# Patient Record
Sex: Female | Born: 1940 | Race: White | Hispanic: No | Marital: Married
Health system: Southern US, Community
[De-identification: ages and names within clinical notes are randomized; demographics above are authoritative.]

---

## 1998-12-05 ENCOUNTER — Emergency Department (HOSPITAL_COMMUNITY): Admission: EM | Admit: 1998-12-05 | Discharge: 1998-12-05 | Payer: Self-pay | Admitting: Emergency Medicine

## 1999-12-28 ENCOUNTER — Other Ambulatory Visit: Admission: RE | Admit: 1999-12-28 | Discharge: 1999-12-28 | Payer: Self-pay | Admitting: Obstetrics and Gynecology

## 2001-01-22 ENCOUNTER — Other Ambulatory Visit: Admission: RE | Admit: 2001-01-22 | Discharge: 2001-01-22 | Payer: Self-pay | Admitting: Obstetrics and Gynecology

## 2002-01-24 ENCOUNTER — Other Ambulatory Visit: Admission: RE | Admit: 2002-01-24 | Discharge: 2002-01-24 | Payer: Self-pay | Admitting: Obstetrics and Gynecology

## 2004-02-25 ENCOUNTER — Other Ambulatory Visit: Admission: RE | Admit: 2004-02-25 | Discharge: 2004-02-25 | Payer: Self-pay | Admitting: Obstetrics and Gynecology

## 2004-06-01 ENCOUNTER — Emergency Department (HOSPITAL_COMMUNITY): Admission: EM | Admit: 2004-06-01 | Discharge: 2004-06-01 | Payer: Self-pay | Admitting: Emergency Medicine

## 2005-09-25 ENCOUNTER — Emergency Department (HOSPITAL_COMMUNITY): Admission: EM | Admit: 2005-09-25 | Discharge: 2005-09-25 | Payer: Self-pay | Admitting: Emergency Medicine

## 2007-06-06 ENCOUNTER — Emergency Department (HOSPITAL_COMMUNITY): Admission: EM | Admit: 2007-06-06 | Discharge: 2007-06-06 | Payer: Self-pay | Admitting: Emergency Medicine

## 2008-11-02 ENCOUNTER — Emergency Department (HOSPITAL_BASED_OUTPATIENT_CLINIC_OR_DEPARTMENT_OTHER): Admission: EM | Admit: 2008-11-02 | Discharge: 2008-11-02 | Payer: Self-pay | Admitting: Emergency Medicine

## 2009-10-16 ENCOUNTER — Emergency Department (HOSPITAL_BASED_OUTPATIENT_CLINIC_OR_DEPARTMENT_OTHER): Admission: EM | Admit: 2009-10-16 | Discharge: 2009-10-16 | Payer: Self-pay | Admitting: Emergency Medicine

## 2009-10-19 ENCOUNTER — Emergency Department (HOSPITAL_COMMUNITY): Admission: EM | Admit: 2009-10-19 | Discharge: 2009-10-19 | Payer: Self-pay | Admitting: Family Medicine

## 2009-11-13 ENCOUNTER — Ambulatory Visit: Payer: Self-pay | Admitting: Diagnostic Radiology

## 2009-11-13 ENCOUNTER — Inpatient Hospital Stay (HOSPITAL_COMMUNITY): Admission: RE | Admit: 2009-11-13 | Discharge: 2009-11-17 | Payer: Self-pay | Admitting: Internal Medicine

## 2009-11-13 ENCOUNTER — Encounter: Payer: Self-pay | Admitting: Emergency Medicine

## 2009-11-15 ENCOUNTER — Ambulatory Visit: Payer: Self-pay | Admitting: Gastroenterology

## 2009-11-16 ENCOUNTER — Encounter: Payer: Self-pay | Admitting: Gastroenterology

## 2009-11-16 ENCOUNTER — Ambulatory Visit: Payer: Self-pay | Admitting: Oncology

## 2009-11-16 ENCOUNTER — Encounter (INDEPENDENT_AMBULATORY_CARE_PROVIDER_SITE_OTHER): Payer: Self-pay | Admitting: Internal Medicine

## 2009-11-17 ENCOUNTER — Encounter (INDEPENDENT_AMBULATORY_CARE_PROVIDER_SITE_OTHER): Payer: Self-pay | Admitting: *Deleted

## 2009-11-25 ENCOUNTER — Encounter (INDEPENDENT_AMBULATORY_CARE_PROVIDER_SITE_OTHER): Payer: Self-pay | Admitting: *Deleted

## 2010-01-11 ENCOUNTER — Ambulatory Visit: Payer: Self-pay | Admitting: Gastroenterology

## 2010-01-11 DIAGNOSIS — E119 Type 2 diabetes mellitus without complications: Secondary | ICD-10-CM

## 2010-01-12 ENCOUNTER — Telehealth: Payer: Self-pay | Admitting: Gastroenterology

## 2010-01-25 ENCOUNTER — Encounter: Payer: Self-pay | Admitting: Gastroenterology

## 2010-02-04 ENCOUNTER — Ambulatory Visit: Payer: Self-pay | Admitting: Gastroenterology

## 2010-02-04 DIAGNOSIS — K5289 Other specified noninfective gastroenteritis and colitis: Secondary | ICD-10-CM | POA: Insufficient documentation

## 2010-02-08 ENCOUNTER — Telehealth: Payer: Self-pay | Admitting: Gastroenterology

## 2010-02-15 ENCOUNTER — Telehealth: Payer: Self-pay | Admitting: Gastroenterology

## 2010-03-15 ENCOUNTER — Ambulatory Visit: Payer: Self-pay | Admitting: Gastroenterology

## 2010-04-21 ENCOUNTER — Encounter (INDEPENDENT_AMBULATORY_CARE_PROVIDER_SITE_OTHER): Payer: Self-pay | Admitting: *Deleted

## 2011-01-13 NOTE — Progress Notes (Signed)
Summary: update  Phone Note Call from Patient Call back at Home Phone [phone]   Caller: Patient Call For: Dr. Arlyce Dice Reason for Call: Talk to Nurse Summary of Call: pt calling to give update on Entocort... pt says "this seems to be doing the trick"... pt also states that she has an upcoming appt, but doesnt feel this is necessary if the meds are working... pt would like aother opinion on that Initial call taken by: Vallarie Mare,  February 15, 2010 9:12 AM  Follow-up for Phone Call         Pt's last ov was 02/04/2010. Next appt. is scheduled for 03/15/2010. Need your advice as pt. doesn't  think visit necessary since Entocort is working. Follow-up by: Teryl Lucy RN,  February 15, 2010 10:15 AM  Additional Follow-up for Phone Call Additional follow up Details #1::        needs OV in 4 weeks Additional Follow-up by: Louis Meckel MD,  February 15, 2010 10:38 AM    Additional Follow-up for Phone Call Additional follow up Details #2::     Pt. will keep appt. for 03/15/2010. Follow-up by: Teryl Lucy RN,  February 15, 2010 10:46 AM

## 2011-01-13 NOTE — Progress Notes (Signed)
Summary: CONDITION UPDATE  Phone Note Call from Patient Call back at Home Phone [phone]   Caller: Patient Call For: Dr. Arlyce Dice Reason for Call: Talk to Nurse Summary of Call: Pt said the Pepto-Bismol is not working with her Diarrhea. Wants to know what else she can do? Initial call taken by: Karna Christmas,  February 08, 2010 9:15 AM  Follow-up for Phone Call        CONDITION UPDATE--Pt. was seen 02-04-10, advised to try Pepto-bismol for diarrhea, "It didn't work at all"  DR.KAPLAN PLEASE FURTHER ADVISE  Follow-up by: Laureen Ochs LPN,  February 08, 2010 10:00 AM  Additional Follow-up for Phone Call Additional follow up Details #1::        try entocort 9mg  once daily.  c/b in 4-5 days Additional Follow-up by: Louis Meckel MD,  February 08, 2010 4:52 PM    Additional Follow-up for Phone Call Additional follow up Details #2::    Above MD orders reviewed with patient. Meds. to pharmacy. Pt. will c/b with update in 1 week, sooner as needed. Follow-up by: Laureen Ochs LPN,  February 09, 2010 9:13 AM  New/Updated Medications: ENTOCORT EC 3 MG  CP24 (BUDESONIDE) Take 3 capsules 1 time daily Prescriptions: ENTOCORT EC 3 MG  CP24 (BUDESONIDE) Take 3 capsules 1 time daily  #90 x 3   Entered by:   Laureen Ochs LPN   Authorized by:   Louis Meckel MD   Signed by:   Laureen Ochs LPN on 32/44/0102   Method used:   Electronically to        [facility] [address] #[id number]* (retail)       [address]       [place], [place]  [address]       Ph: [phone]       Fax: [phone]   RxID:   [id number]

## 2011-01-13 NOTE — Assessment & Plan Note (Signed)
Summary: [facility] F/U--[facility]   History of Present Illness Visit Type: Initial Visit Primary GI MD: [name] MD [facility] Primary Provider: Adrian Prince, MD Chief Complaint: Hsop. F/U colitis; patient still having some diarrhea History of Present Illness:   [name] has returned for followup for colitis.  In December, 2010 she was hospitalized with hypercalcemia and diarrhea.  Colonoscopy demonstrated areas as mucosal hemorrhage.  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  She complains of diarrhea consisting of at least 2 bowel movements a day.  They are accompanied  by urgency.  She has no abdominal  pain or bleeding.  other medical problems including type 1 diabetes and hypertension are stable.  She has an abnormal serum protein electrophoresis and is undergoing workup for possible multiple myeloma   GI Review of Systems      Denies abdominal pain, acid reflux, belching, bloating, chest pain, dysphagia with liquids, dysphagia with solids, heartburn, loss of appetite, nausea, vomiting, vomiting blood, weight loss, and  weight gain.      Reports diarrhea.     Denies anal fissure, black tarry stools, change in bowel habit, constipation, diverticulosis, fecal incontinence, heme positive stool, hemorrhoids, irritable bowel syndrome, jaundice, light color stool, liver problems, rectal bleeding, and  rectal pain. Preventive Screening-Counseling & Management  Alcohol-Tobacco     Smoking Status: quit      Drug Use:  no.      Current Medications (verified): 1)  Lantus 100 Unit/ml Soln (Insulin Glargine) .... 7-10 Units Each Evening 2)  Novolog 100 Unit/ml Soln (Insulin Aspart) .... Sliding Scale 3)  Aspirin 81 Mg Tbec (Aspirin) .Marland Kitchen.. 1 By Mouth Once Daily 4)  Synthroid 175 Mcg Tabs (Levothyroxine Sodium) .Marland Kitchen.. 1 By Mouth Once Daily 5)  Lipitor 10 Mg Tabs (Atorvastatin Calcium) .Marland Kitchen.. 1 By Mouth Once Daily 6)  Multivitamins  Tabs (Multiple Vitamin) .Marland Kitchen.. 1 By Mouth Once  Daily 7)  Diovan 320 Mg Tabs (Valsartan) .Marland Kitchen.. 1 By Mouth Once Daily 8)  Ramipril 10 Mg Caps (Ramipril) .Marland Kitchen.. 1 By Mouth Two Times A Day 9)  Boniva 150 Mg Tabs (Ibandronate Sodium) .Marland Kitchen.. 1 By Mouth Every Month 10)  Ambien 10 Mg Tabs (Zolpidem Tartrate) .... As Needed 11)  Align  Caps (Probiotic Product) .Marland Kitchen.. 1 By Mouth Once Daily 12)  Sm Vitamin D3 1000 Unit Tabs (Cholecalciferol) .... Once Daily 13)  Fish Oil 1000 Mg Caps (Omega-3 Fatty Acids) .... Once Daily  Allergies (verified): No Known Drug Allergies  Past History:  Past Medical History: Reviewed history from 01/07/2010 and no changes required. mild colitis hypercalcemia abnormal serum protein electrohoresis with myeloma hyperkalemia trivial anemia type 1 diabetes Hypertension osteoporosis  Past Surgical History: Appendectomy 1952  Family History: Family History of Diabetes:  No FH of Colon Cancer:  Social History: Occupation: Retired Patient is a former smoker.  Alcohol Use - yes Daily Caffeine Use Illicit Drug Use - no Smoking Status:  quit Drug Use:  no  Review of Systems  The patient denies allergy/sinus, anemia, anxiety-new, arthritis/joint pain, back pain, blood in urine, breast changes/lumps, change in vision, confusion, cough, coughing up blood, depression-new, fainting, fatigue, fever, headaches-new, hearing problems, heart murmur, heart rhythm changes, itching, menstrual pain, muscle pains/cramps, night sweats, nosebleeds, pregnancy symptoms, shortness of breath, skin rash, sleeping problems, sore throat, swelling of feet/legs, swollen lymph glands, thirst - excessive , urination - excessive , urination changes/pain, urine leakage, vision changes, and voice change.    Vital Signs:  Patient profile:   70 year  old female Height:      63.5 inches Weight:      134.13 pounds BMI:     23.47 Pulse rate:   64 / minute Pulse rhythm:   regular BP sitting:   132 / 64  (left arm) Cuff size:   regular  Vitals  Entered By: June McMurray CMA Duncan Dull) (January 11, 2010 10:31 AM)   Impression & Recommendations:  Problem # 1:  OTH&UNSPEC NONINFECTIOUS GASTROENTERITIS&COLITIS (ICD-558.9) Patient appears to have collagenous colitis and is moderately symptomatic.  Recommendations #1 trial of lialda 2.4 g daily  Problem # 2:  ? of MULTIPLE  MYELOMA (ICD-203.00) Workup per Dr. Evlyn Kanner  Problem # 3:  DM (ICD-250.00) Assessment: Comment Only Prescriptions: LIALDA 1.2 GM TBEC (MESALAMINE) take 2 tabs daily  #60 x 2   Entered and Authorized by:   Louis Meckel MD   Signed by:   Louis Meckel MD on 01/11/2010   Method used:   Electronically to        [facility] [address] #[id number]* (retail)       [address]       [place], [place]  [address]       Ph: [phone]       Fax: [phone]   RxID:   [id number]

## 2011-01-13 NOTE — Letter (Signed)
Summary: Office Visit Letter   Gastroenterology  [address] [place], [place] [address]   Phone: [phone]  Fax: [phone]      Apr 21, 2010 MRN: [id number]   Ann Carroll [address] [place], [place]  [address]   Dear [name],   According to our records, it is time for you to schedule a follow-up office visit with [place] in the month of June 2011.   At your convenience, please call [phone] (option #2)to schedule an office visit. If you have any questions, concerns, or feel that this letter is in error, we would appreciate your call.   Sincerely,   Barbette Hair. Arlyce Dice, M.D.  [facility]astro[facility]ology [facility] [phone]

## 2011-01-13 NOTE — Progress Notes (Signed)
Summary: med change request  Phone Note Call from Patient Call back at Home Phone [phone]   Caller: Patient Call For: Dr. Arlyce Dice Reason for Call: Talk to Nurse Summary of Call: pt rx'ed medication for diarrhea yesterday per pt, but pt's ins will not cover it... pt doesnt recall med name... is there something else she can be rx'ed or pt wants to know if Dr. Arlyce Dice will call her ins co Initial call taken by: Vallarie Mare,  January 12, 2010 9:00 AM  Follow-up for Phone Call        Pt. will see what meds. ins. will cover and call me back   Teryl Lucy RN  January 12, 2010 9:40 AM Insurance will pay for Asulfadine 500 mg. onTier 1,Tier 2 they will pay for Apriso and Asacol SR 400mg . Follow-up by: Teryl Lucy RN,  January 12, 2010 9:58 AM  Additional Follow-up for Phone Call Additional follow up Details #1::        try apriso 1.5gm once daily  Additional Follow-up by: Louis Meckel MD,  January 12, 2010 11:05 AM    Additional Follow-up for Phone Call Additional follow up Details #2::    Pt. ntfd. and rx. sent to pharmacy. Follow-up by: Teryl Lucy RN,  January 12, 2010 3:44 PM  New/Updated Medications: APRISO 0.375 GM XR24H-CAP (MESALAMINE) Take 4 tablets each a.m. Prescriptions: APRISO 0.375 GM XR24H-CAP (MESALAMINE) Take 4 tablets each a.m.  #120 x 11   Entered by:   Teryl Lucy RN   Authorized by:   Louis Meckel MD   Signed by:   Teryl Lucy RN on 01/12/2010   Method used:   Electronically to        [facility] #[id number]* (retail)       [address]       [place], [place]  [address]       Ph: [phone]       Fax: [phone]   RxID:   [id number]

## 2011-01-13 NOTE — Assessment & Plan Note (Signed)
Summary: 3-WEEK F/U APPT...LSW.   History of Present Illness Visit Type: Follow-up Visit Primary GI MD: [name] MD [facility] Primary Provider: Illene Regulus, MD Chief Complaint: 3 wk follow-up still c/o diarrhea History of Present Illness:   Ann Carroll has returned for followup of her collagenous colitis.  Symptoms did not improve with apriso (insurance would not pay for lialda). She continues with several bowel movements a day with urgency.   GI Review of Systems      Denies abdominal pain, acid reflux, belching, bloating, chest pain, dysphagia with liquids, dysphagia with solids, heartburn, loss of appetite, nausea, vomiting, vomiting blood, weight loss, and  weight gain.      Reports diarrhea.     Denies anal fissure, black tarry stools, change in bowel habit, constipation, diverticulosis, fecal incontinence, heme positive stool, hemorrhoids, irritable bowel syndrome, jaundice, light color stool, liver problems, rectal bleeding, and  rectal pain.    Current Medications (verified): 1)  Lantus 100 Unit/ml Soln (Insulin Glargine) .... 7-10 Units Each Evening 2)  Novolog 100 Unit/ml Soln (Insulin Aspart) .... Sliding Scale 3)  Aspirin 81 Mg Tbec (Aspirin) .Marland Kitchen.. 1 By Mouth Once Daily 4)  Synthroid 175 Mcg Tabs (Levothyroxine Sodium) .Marland Kitchen.. 1 By Mouth Once Daily 5)  Lipitor 10 Mg Tabs (Atorvastatin Calcium) .Marland Kitchen.. 1 By Mouth Once Daily 6)  Multivitamins  Tabs (Multiple Vitamin) .Marland Kitchen.. 1 By Mouth Once Daily 7)  Diovan 320 Mg Tabs (Valsartan) .Marland Kitchen.. 1 By Mouth Once Daily 8)  Ramipril 10 Mg Caps (Ramipril) .Marland Kitchen.. 1 By Mouth Two Times A Day 9)  Boniva 150 Mg Tabs (Ibandronate Sodium) .Marland Kitchen.. 1 By Mouth Every Month 10)  Ambien 10 Mg Tabs (Zolpidem Tartrate) .... As Needed 11)  Align  Caps (Probiotic Product) .Marland Kitchen.. 1 By Mouth Once Daily 12)  Sm Vitamin D3 1000 Unit Tabs (Cholecalciferol) .... Once Daily 13)  Fish Oil 1000 Mg Caps (Omega-3 Fatty Acids) .... Once Daily 14)  Apriso 0.375 Gm Xr24h-Cap  (Mesalamine) .... Take 4 Tablets Each A.m.  Allergies (verified): No Known Drug Allergies  Past History:  Past Medical History: Reviewed history from 01/07/2010 and no changes required. mild colitis hypercalcemia abnormal serum protein electrohoresis with myeloma hyperkalemia trivial anemia type 1 diabetes Hypertension osteoporosis  Past Surgical History: Reviewed history from 01/11/2010 and no changes required. Appendectomy 1952  Family History: Reviewed history from 01/11/2010 and no changes required. Family History of Diabetes:  No FH of Colon Cancer:  Social History: Reviewed history from 01/11/2010 and no changes required. Occupation: Retired Patient is a former smoker.  Alcohol Use - yes Daily Caffeine Use Illicit Drug Use - no  Review of Systems  The patient denies allergy/sinus, anemia, anxiety-new, arthritis/joint pain, back pain, blood in urine, breast changes/lumps, change in vision, confusion, cough, coughing up blood, depression-new, fainting, fatigue, fever, headaches-new, hearing problems, heart murmur, heart rhythm changes, itching, muscle pains/cramps, night sweats, nosebleeds, shortness of breath, skin rash, sleeping problems, sore throat, swelling of feet/legs, swollen lymph glands, thirst - excessive, urination - excessive, urination changes/pain, urine leakage, vision changes, and voice change.    Vital Signs:  Patient profile:   70 year old female Height:      63.5 inches Weight:      131.38 pounds BMI:     22.99 Pulse rate:   88 / minute Pulse rhythm:   regular BP sitting:   126 / 58  (left arm)  Vitals Entered By: Milford Cage NCMA (February 04, 2010 9:49 AM)   Impression &  Recommendations:  Problem # 1:  COLITIS (ICD-558.9) Assessment Unchanged Plan trial of Pepto-Bismol q.i.d.  If this is not effective I will try Entocort  Patient Instructions: 1)  CC Dr. Laurene Footman

## 2011-01-13 NOTE — Assessment & Plan Note (Signed)
Summary: F/U APPT...LSW.   History of Present Illness Visit Type: Follow-up Visit Primary GI MD: [name] MD [facility] Primary Provider: Illene Regulus, MD Chief Complaint: follow-up No GI complaints at this time History of Present Illness:   Ann Carroll has returned for followup of her collagenous colitis.  Symptoms did not improve with Pepto-Bismol.  On a regimen of Entocort 9 mg daily she has had a complete response.  She currently has no GI complaints.   GI Review of Systems      Denies abdominal pain, acid reflux, belching, bloating, chest pain, dysphagia with liquids, dysphagia with solids, heartburn, loss of appetite, nausea, vomiting, vomiting blood, weight loss, and  weight gain.        Denies anal fissure, black tarry stools, change in bowel habit, constipation, diarrhea, diverticulosis, fecal incontinence, heme positive stool, hemorrhoids, irritable bowel syndrome, jaundice, light color stool, liver problems, rectal bleeding, and  rectal pain.    Current Medications (verified): 1)  Lantus 100 Unit/ml Soln (Insulin Glargine) .... 7-10 Units Each Evening 2)  Novolog 100 Unit/ml Soln (Insulin Aspart) .... Sliding Scale 3)  Aspirin 81 Mg Tbec (Aspirin) .Marland Kitchen.. 1 By Mouth Once Daily 4)  Synthroid 175 Mcg Tabs (Levothyroxine Sodium) .Marland Kitchen.. 1 By Mouth Once Daily 5)  Lipitor 10 Mg Tabs (Atorvastatin Calcium) .Marland Kitchen.. 1 By Mouth Once Daily 6)  Multivitamins  Tabs (Multiple Vitamin) .Marland Kitchen.. 1 By Mouth Once Daily 7)  Diovan 320 Mg Tabs (Valsartan) .Marland Kitchen.. 1 By Mouth Once Daily 8)  Ramipril 10 Mg Caps (Ramipril) .Marland Kitchen.. 1 By Mouth Two Times A Day 9)  Boniva 150 Mg Tabs (Ibandronate Sodium) .Marland Kitchen.. 1 By Mouth Every Month 10)  Ambien 10 Mg Tabs (Zolpidem Tartrate) .... As Needed 11)  Align  Caps (Probiotic Product) .Marland Kitchen.. 1 By Mouth Once Daily 12)  Sm Vitamin D3 1000 Unit Tabs (Cholecalciferol) .... Once Daily 13)  Fish Oil 1000 Mg Caps (Omega-3 Fatty Acids) .... Once Daily 14)  Apriso 0.375 Gm Xr24h-Cap  (Mesalamine) .... Take 4 Tablets Each A.m. 15)  Entocort Ec 3 Mg  Cp24 (Budesonide) .... Take 3 Capsules 1 Time Daily  Allergies (verified): No Known Drug Allergies  Past History:  Past Medical History: Reviewed history from 01/07/2010 and no changes required. mild colitis hypercalcemia abnormal serum protein electrohoresis with myeloma hyperkalemia trivial anemia type 1 diabetes Hypertension osteoporosis  Past Surgical History: Reviewed history from 01/11/2010 and no changes required. Appendectomy 1952  Family History: Reviewed history from 01/11/2010 and no changes required. Family History of Diabetes:  No FH of Colon Cancer:  Social History: Reviewed history from 01/11/2010 and no changes required. Occupation: Retired Patient is a former smoker.  Alcohol Use - yes Daily Caffeine Use Illicit Drug Use - no  Review of Systems       The patient complains of allergy/sinus.  The patient denies anemia, anxiety-new, arthritis/joint pain, back pain, blood in urine, breast changes/lumps, change in vision, confusion, cough, coughing up blood, depression-new, fainting, fatigue, fever, headaches-new, hearing problems, heart murmur, heart rhythm changes, itching, menstrual pain, muscle pains/cramps, night sweats, nosebleeds, pregnancy symptoms, shortness of breath, skin rash, sleeping problems, sore throat, swelling of feet/legs, swollen lymph glands, thirst - excessive , urination - excessive , urination changes/pain, urine leakage, vision changes, and voice change.    Vital Signs:  Patient profile:   70 year old female Height:      63.5 inches Weight:      137.50 pounds BMI:     24.06 Pulse rate:  72 / minute Pulse rhythm:   regular BP sitting:   130 / 68  (left arm)  Vitals Entered By: Milford Cage NCMA (March 15, 2010 8:35 AM)   Impression & Recommendations:  Problem # 1:  COLITIS (ICD-558.9) Patient has had a complete response to Entocort.  Recommendations #1  reduce Entocort to 3 mg daily for one month and then discontinue  Patient was carefully instructed to contact me if she becomes symptomatic after discontinuing this medication.  Patient Instructions: 1)  Please schedule a follow up appointment for 2 months 2)  The medication list was reviewed and reconciled.  All changed / newly prescribed medications were explained.  A complete medication list was provided to the patient / caregiver. 3)  Reduce Entocort to one pack daily for one month and then discontinue 4)  Please schedule a follow-up appointment in 2 months.

## 2011-03-15 LAB — DIFFERENTIAL
Basophils Absolute: 0 10*3/uL (ref 0.0–0.1)
Basophils Absolute: 0 10*3/uL (ref 0.0–0.1)
Basophils Relative: 1 % (ref 0–1)
Basophils Relative: 1 % (ref 0–1)
Eosinophils Absolute: 0.2 10*3/uL (ref 0.0–0.7)
Eosinophils Absolute: 0.2 10*3/uL (ref 0.0–0.7)
Eosinophils Relative: 2 % (ref 0–5)
Eosinophils Relative: 3 % (ref 0–5)
Lymphocytes Relative: 25 % (ref 12–46)
Lymphocytes Relative: 25 % (ref 12–46)
Lymphs Abs: 2.3 10*3/uL (ref 0.7–4.0)
Lymphs Abs: 1.5 10*3/uL (ref 0.7–4.0)
Monocytes Absolute: 1.5 10*3/uL — ABNORMAL HIGH (ref 0.1–1.0)
Monocytes Absolute: 1 10*3/uL (ref 0.1–1.0)
Monocytes Relative: 17 % — ABNORMAL HIGH (ref 3–12)
Monocytes Relative: 16 % — ABNORMAL HIGH (ref 3–12)
Neutro Abs: 5.1 10*3/uL (ref 1.7–7.7)
Neutro Abs: 3.2 10*3/uL (ref 1.7–7.7)
Neutrophils Relative %: 55 % (ref 43–77)
Neutrophils Relative %: 55 % (ref 43–77)

## 2011-03-15 LAB — PROTEIN ELECTROPH W RFLX QUANT IMMUNOGLOBULINS
Albumin ELP: 50.3 % — ABNORMAL LOW (ref 55.8–66.1)
Alpha-1-Globulin: 10.2 % — ABNORMAL HIGH (ref 2.9–4.9)
Alpha-2-Globulin: 20.8 % — ABNORMAL HIGH (ref 7.1–11.8)
Beta 2: 4.8 % (ref 3.2–6.5)
Beta Globulin: 6.9 % (ref 4.7–7.2)
Gamma Globulin: 7 % — ABNORMAL LOW (ref 11.1–18.8)
M-Spike, %: NOT DETECTED g/dL
Total Protein ELP: 5.1 g/dL — ABNORMAL LOW (ref 6.0–8.3)

## 2011-03-15 LAB — MAGNESIUM: Magnesium: 1.5 mg/dL (ref 1.5–2.5)

## 2011-03-15 LAB — HEMOGLOBIN AND HEMATOCRIT, BLOOD
HCT: 31.1 % — ABNORMAL LOW (ref 36.0–46.0)
HCT: 32 % — ABNORMAL LOW (ref 36.0–46.0)
HCT: 33 % — ABNORMAL LOW (ref 36.0–46.0)
HCT: 33.7 % — ABNORMAL LOW (ref 36.0–46.0)
HCT: 35.3 % — ABNORMAL LOW (ref 36.0–46.0)
Hemoglobin: 10.3 g/dL — ABNORMAL LOW (ref 12.0–15.0)
Hemoglobin: 10.8 g/dL — ABNORMAL LOW (ref 12.0–15.0)
Hemoglobin: 10.9 g/dL — ABNORMAL LOW (ref 12.0–15.0)
Hemoglobin: 11.2 g/dL — ABNORMAL LOW (ref 12.0–15.0)
Hemoglobin: 11.7 g/dL — ABNORMAL LOW (ref 12.0–15.0)
Hemoglobin: 11.8 g/dL — ABNORMAL LOW (ref 12.0–15.0)

## 2011-03-15 LAB — CBC
HCT: 37.2 % (ref 36.0–46.0)
HCT: 29.8 % — ABNORMAL LOW (ref 36.0–46.0)
HCT: 31.9 % — ABNORMAL LOW (ref 36.0–46.0)
Hemoglobin: 12.8 g/dL (ref 12.0–15.0)
Hemoglobin: 10.5 g/dL — ABNORMAL LOW (ref 12.0–15.0)
Hemoglobin: 9.9 g/dL — ABNORMAL LOW (ref 12.0–15.0)
MCHC: 34.5 g/dL (ref 30.0–36.0)
MCHC: 33 g/dL (ref 30.0–36.0)
MCHC: 33.2 g/dL (ref 30.0–36.0)
MCV: 95.3 fL (ref 78.0–100.0)
MCV: 94.8 fL (ref 78.0–100.0)
MCV: 95.5 fL (ref 78.0–100.0)
Platelets: 311 10*3/uL (ref 150–400)
Platelets: 219 10*3/uL (ref 150–400)
Platelets: 248 10*3/uL (ref 150–400)
RBC: 3.9 MIL/uL (ref 3.87–5.11)
RBC: 3.34 MIL/uL — ABNORMAL LOW (ref 3.87–5.11)
RDW: 12.2 % (ref 11.5–15.5)
RDW: 12.8 % (ref 11.5–15.5)
RDW: 13.2 % (ref 11.5–15.5)
WBC: 9.1 10*3/uL (ref 4.0–10.5)
WBC: 5.9 10*3/uL (ref 4.0–10.5)

## 2011-03-15 LAB — COMPREHENSIVE METABOLIC PANEL
ALT: 23 U/L (ref 0–35)
ALT: 16 U/L (ref 0–35)
AST: 32 U/L (ref 0–37)
AST: 19 U/L (ref 0–37)
Albumin: 3.1 g/dL — ABNORMAL LOW (ref 3.5–5.2)
Albumin: 1.9 g/dL — ABNORMAL LOW (ref 3.5–5.2)
Alkaline Phosphatase: 74 U/L (ref 39–117)
Alkaline Phosphatase: 47 U/L (ref 39–117)
BUN: 47 mg/dL — ABNORMAL HIGH (ref 6–23)
BUN: 33 mg/dL — ABNORMAL HIGH (ref 6–23)
CO2: 30 mEq/L (ref 19–32)
CO2: 25 mEq/L (ref 19–32)
Calcium: 12.8 mg/dL — ABNORMAL HIGH (ref 8.4–10.5)
Calcium: 10.7 mg/dL — ABNORMAL HIGH (ref 8.4–10.5)
Chloride: 96 mEq/L (ref 96–112)
Chloride: 101 mEq/L (ref 96–112)
Creatinine, Ser: 1.6 mg/dL — ABNORMAL HIGH (ref 0.4–1.2)
Creatinine, Ser: 1.27 mg/dL — ABNORMAL HIGH (ref 0.4–1.2)
GFR calc Af Amer: 39 mL/min — ABNORMAL LOW (ref >60)
GFR calc Af Amer: 51 mL/min — ABNORMAL LOW (ref >60)
GFR calc non Af Amer: 32 mL/min — ABNORMAL LOW (ref >60)
GFR calc non Af Amer: 42 mL/min — ABNORMAL LOW (ref >60)
Glucose, Bld: 110 mg/dL — ABNORMAL HIGH (ref 70–99)
Glucose, Bld: 182 mg/dL — ABNORMAL HIGH (ref 70–99)
Potassium: 4.8 mEq/L (ref 3.5–5.1)
Potassium: 4.4 mEq/L (ref 3.5–5.1)
Sodium: 133 mEq/L — ABNORMAL LOW (ref 135–145)
Sodium: 131 mEq/L — ABNORMAL LOW (ref 135–145)
Total Bilirubin: 0.3 mg/dL (ref 0.3–1.2)
Total Bilirubin: 0.4 mg/dL (ref 0.3–1.2)
Total Protein: 5.5 g/dL — ABNORMAL LOW (ref 6.0–8.3)
Total Protein: 4.1 g/dL — ABNORMAL LOW (ref 6.0–8.3)

## 2011-03-15 LAB — URINALYSIS, ROUTINE W REFLEX MICROSCOPIC
Glucose, UA: NEGATIVE mg/dL
Hgb urine dipstick: NEGATIVE
Ketones, ur: 15 mg/dL — AB
Nitrite: NEGATIVE
Protein, ur: NEGATIVE mg/dL
Specific Gravity, Urine: 1.019 (ref 1.005–1.030)
Urobilinogen, UA: 0.2 mg/dL (ref 0.0–1.0)
pH: 5.5 (ref 5.0–8.0)

## 2011-03-15 LAB — BASIC METABOLIC PANEL
BUN: 22 mg/dL (ref 6–23)
BUN: 8 mg/dL (ref 6–23)
CO2: 26 mEq/L (ref 19–32)
Chloride: 106 mEq/L (ref 96–112)
Creatinine, Ser: 0.87 mg/dL (ref 0.4–1.2)
GFR calc non Af Amer: 44 mL/min — ABNORMAL LOW (ref >60)
Glucose, Bld: 138 mg/dL — ABNORMAL HIGH (ref 70–99)
Glucose, Bld: 259 mg/dL — ABNORMAL HIGH (ref 70–99)
Potassium: 3.7 mEq/L (ref 3.5–5.1)
Potassium: 4.4 mEq/L (ref 3.5–5.1)
Sodium: 132 mEq/L — ABNORMAL LOW (ref 135–145)

## 2011-03-15 LAB — RETICULIN ANTIBODIES, IGA W TITER

## 2011-03-15 LAB — VITAMIN D 25 HYDROXY (VIT D DEFICIENCY, FRACTURES): Vit D, 25-Hydroxy: 31 ng/mL (ref 30–89)

## 2011-03-15 LAB — URINE MICROSCOPIC-ADD ON

## 2011-03-15 LAB — GLUCOSE, CAPILLARY
Glucose-Capillary: 104 mg/dL — ABNORMAL HIGH (ref 70–99)
Glucose-Capillary: 113 mg/dL — ABNORMAL HIGH (ref 70–99)
Glucose-Capillary: 194 mg/dL — ABNORMAL HIGH (ref 70–99)
Glucose-Capillary: 211 mg/dL — ABNORMAL HIGH (ref 70–99)
Glucose-Capillary: 224 mg/dL — ABNORMAL HIGH (ref 70–99)
Glucose-Capillary: 239 mg/dL — ABNORMAL HIGH (ref 70–99)
Glucose-Capillary: 261 mg/dL — ABNORMAL HIGH (ref 70–99)
Glucose-Capillary: 282 mg/dL — ABNORMAL HIGH (ref 70–99)

## 2011-03-15 LAB — URINE CULTURE: Colony Count: 2000

## 2011-03-15 LAB — UIFE/LIGHT CHAINS/TP QN, 24-HR UR
Albumin, U: DETECTED
Alpha 1, Urine: DETECTED — AB
Alpha 2, Urine: DETECTED — AB
Beta, Urine: DETECTED — AB
Free Kappa Lt Chains,Ur: 2.83 mg/dL — ABNORMAL HIGH (ref 0.04–1.51)
Free Kappa/Lambda Ratio: 10.48 ratio — ABNORMAL HIGH (ref 0.46–4.00)
Free Lambda Excretion/Day: 1.69 mg/d
Free Lambda Lt Chains,Ur: 0.27 mg/dL (ref 0.08–1.01)
Free Lt Chn Excr Rate: 17.69 mg/d
Gamma Globulin, Urine: DETECTED — AB
Time: 24 hours
Total Protein, Urine-Ur/day: 32 mg/d (ref 10–140)
Total Protein, Urine: 5.1 mg/dL
Volume, Urine: 625 mL

## 2011-03-15 LAB — PHOSPHORUS: Phosphorus: 3.2 mg/dL (ref 2.3–4.6)

## 2011-03-15 LAB — C-REACTIVE PROTEIN: CRP: 5.6 mg/dL — ABNORMAL HIGH (ref <0.6)

## 2011-03-15 LAB — PTH, INTACT AND CALCIUM
Calcium, Total (PTH): 10.7 mg/dL — ABNORMAL HIGH (ref 8.4–10.5)
PTH: 31.5 pg/mL (ref 14.0–72.0)

## 2011-03-15 LAB — STOOL CULTURE

## 2011-03-15 LAB — FECAL LACTOFERRIN, QUANT: Fecal Lactoferrin: POSITIVE

## 2011-03-15 LAB — MULTIPLE MYELOMA PANEL, SERUM
Albumin ELP: 50.6 % — ABNORMAL LOW (ref 55.8–66.1)
Alpha-1-Globulin: 11.3 % — ABNORMAL HIGH (ref 2.9–4.9)
Alpha-2-Globulin: 21.7 % — ABNORMAL HIGH (ref 7.1–11.8)
Beta 2: 3.2 % (ref 3.2–6.5)
Beta Globulin: 5.8 % (ref 4.7–7.2)

## 2011-03-15 LAB — CLOSTRIDIUM DIFFICILE EIA
C difficile Toxins A+B, EIA: NEGATIVE
C difficile Toxins A+B, EIA: NEGATIVE

## 2011-03-15 LAB — PTH-RELATED PEPTIDE

## 2011-03-15 LAB — CALCIUM, URINE, 24 HOUR
Calcium, 24 hour urine: 113 mg/d (ref 100–250)
Calcium, Ur: 18 mg/dL
Urine Total Volume-UCA24: 625 mL

## 2011-03-15 LAB — TISSUE TRANSGLUTAMINASE, IGA: Tissue Transglutaminase Ab, IgA: 0 U/mL (ref <7)

## 2011-03-15 LAB — TSH: TSH: 3.481 u[IU]/mL (ref 0.350–4.500)

## 2011-03-15 LAB — CALCIUM, IONIZED: Calcium, Ion: 1.79 mmol/L — ABNORMAL HIGH (ref 1.12–1.32)

## 2011-03-15 LAB — OVA AND PARASITE EXAMINATION

## 2011-03-15 LAB — CORTISOL-AM, BLOOD: Cortisol - AM: 15.8 ug/dL (ref 4.3–22.4)

## 2011-03-15 LAB — GLIADIN ANTIBODIES, SERUM: Gliadin IgG: 0.4 U/mL (ref <7)

## 2011-09-14 LAB — DIFFERENTIAL
Basophils Absolute: 0.1
Basophils Relative: 1
Eosinophils Relative: 0
Monocytes Absolute: 0.3
Neutro Abs: 11.5 — ABNORMAL HIGH

## 2011-09-14 LAB — COMPREHENSIVE METABOLIC PANEL
Albumin: 4.8
Alkaline Phosphatase: 114
BUN: 34 — ABNORMAL HIGH
CO2: 25
Chloride: 98
Potassium: 4.8
Total Bilirubin: 0.6

## 2011-09-14 LAB — CBC
HCT: 37.6
Hemoglobin: 12.9
Platelets: 242
RBC: 3.97
WBC: 12.7 — ABNORMAL HIGH

## 2011-09-14 LAB — GLUCOSE, CAPILLARY

## 2015-08-21 ENCOUNTER — Emergency Department (HOSPITAL_COMMUNITY): Payer: Medicare Other

## 2015-08-21 ENCOUNTER — Inpatient Hospital Stay (HOSPITAL_COMMUNITY): Payer: Medicare Other

## 2015-08-21 ENCOUNTER — Encounter (HOSPITAL_COMMUNITY): Admission: EM | Disposition: E | Payer: Medicare Other | Source: Home / Self Care | Attending: Pulmonary Disease

## 2015-08-21 ENCOUNTER — Encounter (HOSPITAL_COMMUNITY): Payer: Self-pay | Admitting: Interventional Cardiology

## 2015-08-21 ENCOUNTER — Inpatient Hospital Stay (HOSPITAL_COMMUNITY)
Admission: EM | Admit: 2015-08-21 | Discharge: 2015-09-12 | DRG: 270 | Disposition: E | Payer: Medicare Other | Attending: Pulmonary Disease | Admitting: Pulmonary Disease

## 2015-08-21 DIAGNOSIS — I462 Cardiac arrest due to underlying cardiac condition: Secondary | ICD-10-CM | POA: Diagnosis present

## 2015-08-21 DIAGNOSIS — I255 Ischemic cardiomyopathy: Secondary | ICD-10-CM | POA: Diagnosis present

## 2015-08-21 DIAGNOSIS — J9690 Respiratory failure, unspecified, unspecified whether with hypoxia or hypercapnia: Secondary | ICD-10-CM

## 2015-08-21 DIAGNOSIS — I5021 Acute systolic (congestive) heart failure: Secondary | ICD-10-CM

## 2015-08-21 DIAGNOSIS — E10649 Type 1 diabetes mellitus with hypoglycemia without coma: Secondary | ICD-10-CM | POA: Diagnosis present

## 2015-08-21 DIAGNOSIS — I4510 Unspecified right bundle-branch block: Secondary | ICD-10-CM | POA: Diagnosis present

## 2015-08-21 DIAGNOSIS — E039 Hypothyroidism, unspecified: Secondary | ICD-10-CM | POA: Diagnosis present

## 2015-08-21 DIAGNOSIS — I214 Non-ST elevation (NSTEMI) myocardial infarction: Secondary | ICD-10-CM | POA: Diagnosis not present

## 2015-08-21 DIAGNOSIS — R402212 Coma scale, best verbal response, none, at arrival to emergency department: Secondary | ICD-10-CM | POA: Diagnosis present

## 2015-08-21 DIAGNOSIS — I10 Essential (primary) hypertension: Secondary | ICD-10-CM | POA: Diagnosis present

## 2015-08-21 DIAGNOSIS — I472 Ventricular tachycardia, unspecified: Secondary | ICD-10-CM

## 2015-08-21 DIAGNOSIS — J9600 Acute respiratory failure, unspecified whether with hypoxia or hypercapnia: Secondary | ICD-10-CM | POA: Diagnosis present

## 2015-08-21 DIAGNOSIS — D696 Thrombocytopenia, unspecified: Secondary | ICD-10-CM | POA: Diagnosis present

## 2015-08-21 DIAGNOSIS — I5043 Acute on chronic combined systolic (congestive) and diastolic (congestive) heart failure: Secondary | ICD-10-CM | POA: Diagnosis present

## 2015-08-21 DIAGNOSIS — I2111 ST elevation (STEMI) myocardial infarction involving right coronary artery: Secondary | ICD-10-CM | POA: Diagnosis present

## 2015-08-21 DIAGNOSIS — S42021A Displaced fracture of shaft of right clavicle, initial encounter for closed fracture: Secondary | ICD-10-CM | POA: Diagnosis present

## 2015-08-21 DIAGNOSIS — Z4509 Encounter for adjustment and management of other cardiac device: Secondary | ICD-10-CM

## 2015-08-21 DIAGNOSIS — Z452 Encounter for adjustment and management of vascular access device: Secondary | ICD-10-CM

## 2015-08-21 DIAGNOSIS — E875 Hyperkalemia: Secondary | ICD-10-CM | POA: Diagnosis present

## 2015-08-21 DIAGNOSIS — R402342 Coma scale, best motor response, flexion withdrawal, at arrival to emergency department: Secondary | ICD-10-CM | POA: Diagnosis present

## 2015-08-21 DIAGNOSIS — E872 Acidosis: Secondary | ICD-10-CM | POA: Diagnosis present

## 2015-08-21 DIAGNOSIS — I469 Cardiac arrest, cause unspecified: Secondary | ICD-10-CM | POA: Diagnosis not present

## 2015-08-21 DIAGNOSIS — J810 Acute pulmonary edema: Secondary | ICD-10-CM | POA: Diagnosis not present

## 2015-08-21 DIAGNOSIS — J9601 Acute respiratory failure with hypoxia: Secondary | ICD-10-CM | POA: Diagnosis not present

## 2015-08-21 DIAGNOSIS — Z794 Long term (current) use of insulin: Secondary | ICD-10-CM

## 2015-08-21 DIAGNOSIS — R402112 Coma scale, eyes open, never, at arrival to emergency department: Secondary | ICD-10-CM | POA: Diagnosis present

## 2015-08-21 DIAGNOSIS — D649 Anemia, unspecified: Secondary | ICD-10-CM | POA: Diagnosis present

## 2015-08-21 DIAGNOSIS — J690 Pneumonitis due to inhalation of food and vomit: Secondary | ICD-10-CM | POA: Insufficient documentation

## 2015-08-21 DIAGNOSIS — I4901 Ventricular fibrillation: Principal | ICD-10-CM | POA: Diagnosis present

## 2015-08-21 DIAGNOSIS — S2241XA Multiple fractures of ribs, right side, initial encounter for closed fracture: Secondary | ICD-10-CM | POA: Diagnosis present

## 2015-08-21 DIAGNOSIS — T82598A Other mechanical complication of other cardiac and vascular devices and implants, initial encounter: Secondary | ICD-10-CM | POA: Diagnosis not present

## 2015-08-21 DIAGNOSIS — I2510 Atherosclerotic heart disease of native coronary artery without angina pectoris: Secondary | ICD-10-CM | POA: Diagnosis not present

## 2015-08-21 DIAGNOSIS — N170 Acute kidney failure with tubular necrosis: Secondary | ICD-10-CM | POA: Diagnosis not present

## 2015-08-21 DIAGNOSIS — G9340 Encephalopathy, unspecified: Secondary | ICD-10-CM | POA: Diagnosis present

## 2015-08-21 DIAGNOSIS — I25110 Atherosclerotic heart disease of native coronary artery with unstable angina pectoris: Secondary | ICD-10-CM | POA: Diagnosis not present

## 2015-08-21 DIAGNOSIS — J811 Chronic pulmonary edema: Secondary | ICD-10-CM | POA: Insufficient documentation

## 2015-08-21 DIAGNOSIS — R570 Cardiogenic shock: Secondary | ICD-10-CM | POA: Diagnosis present

## 2015-08-21 DIAGNOSIS — I5041 Acute combined systolic (congestive) and diastolic (congestive) heart failure: Secondary | ICD-10-CM | POA: Diagnosis not present

## 2015-08-21 DIAGNOSIS — R34 Anuria and oliguria: Secondary | ICD-10-CM | POA: Diagnosis present

## 2015-08-21 DIAGNOSIS — Y92410 Unspecified street and highway as the place of occurrence of the external cause: Secondary | ICD-10-CM

## 2015-08-21 DIAGNOSIS — E876 Hypokalemia: Secondary | ICD-10-CM | POA: Diagnosis not present

## 2015-08-21 DIAGNOSIS — E785 Hyperlipidemia, unspecified: Secondary | ICD-10-CM | POA: Diagnosis present

## 2015-08-21 DIAGNOSIS — G4700 Insomnia, unspecified: Secondary | ICD-10-CM | POA: Diagnosis not present

## 2015-08-21 DIAGNOSIS — I509 Heart failure, unspecified: Secondary | ICD-10-CM

## 2015-08-21 HISTORY — PX: CARDIAC CATHETERIZATION: SHX172

## 2015-08-21 LAB — CBC WITH DIFFERENTIAL/PLATELET
BASOS PCT: 0 % (ref 0–1)
Basophils Absolute: 0 10*3/uL (ref 0.0–0.1)
EOS ABS: 0 10*3/uL (ref 0.0–0.7)
EOS PCT: 0 % (ref 0–5)
HCT: 34.9 % — ABNORMAL LOW (ref 36.0–46.0)
Hemoglobin: 11.7 g/dL — ABNORMAL LOW (ref 12.0–15.0)
LYMPHS ABS: 1.1 10*3/uL (ref 0.7–4.0)
Lymphocytes Relative: 6 % — ABNORMAL LOW (ref 12–46)
MCH: 31.5 pg (ref 26.0–34.0)
MCHC: 33.5 g/dL (ref 30.0–36.0)
MCV: 93.8 fL (ref 78.0–100.0)
Monocytes Absolute: 1.2 10*3/uL — ABNORMAL HIGH (ref 0.1–1.0)
Monocytes Relative: 7 % (ref 3–12)
Neutro Abs: 15.3 10*3/uL — ABNORMAL HIGH (ref 1.7–7.7)
Neutrophils Relative %: 87 % — ABNORMAL HIGH (ref 43–77)
PLATELETS: 226 10*3/uL (ref 150–400)
RBC: 3.72 MIL/uL — AB (ref 3.87–5.11)
RDW: 13.6 % (ref 11.5–15.5)
WBC: 17.6 10*3/uL — AB (ref 4.0–10.5)

## 2015-08-21 LAB — POCT I-STAT, CHEM 8
BUN: 34 mg/dL — ABNORMAL HIGH (ref 6–20)
BUN: 32 mg/dL — ABNORMAL HIGH (ref 6–20)
CALCIUM ION: 1.17 mmol/L (ref 1.13–1.30)
Calcium, Ion: 1.2 mmol/L (ref 1.13–1.30)
Chloride: 110 mmol/L (ref 101–111)
Chloride: 109 mmol/L (ref 101–111)
Creatinine, Ser: 1 mg/dL (ref 0.44–1.00)
Creatinine, Ser: 0.9 mg/dL (ref 0.44–1.00)
Glucose, Bld: 290 mg/dL — ABNORMAL HIGH (ref 65–99)
Glucose, Bld: 201 mg/dL — ABNORMAL HIGH (ref 65–99)
HEMATOCRIT: 36 % (ref 36.0–46.0)
HEMATOCRIT: 31 % — AB (ref 36.0–46.0)
HEMOGLOBIN: 12.2 g/dL (ref 12.0–15.0)
HEMOGLOBIN: 10.5 g/dL — AB (ref 12.0–15.0)
Potassium: 3.7 mmol/L (ref 3.5–5.1)
Potassium: 3 mmol/L — ABNORMAL LOW (ref 3.5–5.1)
SODIUM: 135 mmol/L (ref 135–145)
SODIUM: 141 mmol/L (ref 135–145)
TCO2: 15 mmol/L (ref 0–100)
TCO2: 13 mmol/L (ref 0–100)

## 2015-08-21 LAB — BASIC METABOLIC PANEL
ANION GAP: 10 (ref 5–15)
Anion gap: 15 (ref 5–15)
BUN: 34 mg/dL — AB (ref 6–20)
BUN: 37 mg/dL — ABNORMAL HIGH (ref 6–20)
CALCIUM: 8.8 mg/dL — AB (ref 8.9–10.3)
CHLORIDE: 109 mmol/L (ref 101–111)
CO2: 16 mmol/L — AB (ref 22–32)
CO2: 18 mmol/L — AB (ref 22–32)
CREATININE: 1.31 mg/dL — AB (ref 0.44–1.00)
CREATININE: 1.15 mg/dL — AB (ref 0.44–1.00)
Calcium: 8.9 mg/dL (ref 8.9–10.3)
Chloride: 104 mmol/L (ref 101–111)
GFR calc non Af Amer: 39 mL/min — ABNORMAL LOW (ref >60)
GFR calc non Af Amer: 46 mL/min — ABNORMAL LOW (ref >60)
GFR, EST AFRICAN AMERICAN: 46 mL/min — AB (ref >60)
GFR, EST AFRICAN AMERICAN: 53 mL/min — AB (ref >60)
Glucose, Bld: 286 mg/dL — ABNORMAL HIGH (ref 65–99)
Glucose, Bld: 122 mg/dL — ABNORMAL HIGH (ref 65–99)
POTASSIUM: 3.9 mmol/L (ref 3.5–5.1)
Potassium: 3.8 mmol/L (ref 3.5–5.1)
SODIUM: 137 mmol/L (ref 135–145)
Sodium: 135 mmol/L (ref 135–145)

## 2015-08-21 LAB — GLUCOSE, CAPILLARY
GLUCOSE-CAPILLARY: 248 mg/dL — AB (ref 65–99)
GLUCOSE-CAPILLARY: 243 mg/dL — AB (ref 65–99)
GLUCOSE-CAPILLARY: 164 mg/dL — AB (ref 65–99)
GLUCOSE-CAPILLARY: 139 mg/dL — AB (ref 65–99)
GLUCOSE-CAPILLARY: 114 mg/dL — AB (ref 65–99)
GLUCOSE-CAPILLARY: 153 mg/dL — AB (ref 65–99)
Glucose-Capillary: 263 mg/dL — ABNORMAL HIGH (ref 65–99)
Glucose-Capillary: 199 mg/dL — ABNORMAL HIGH (ref 65–99)
Glucose-Capillary: 111 mg/dL — ABNORMAL HIGH (ref 65–99)

## 2015-08-21 LAB — PREPARE FRESH FROZEN PLASMA
Unit division: 0
Unit division: 0

## 2015-08-21 LAB — CK TOTAL AND CKMB (NOT AT ARMC)
CK, MB: 26.3 ng/mL — ABNORMAL HIGH (ref 0.5–5.0)
Relative Index: 5 — ABNORMAL HIGH (ref 0.0–2.5)
Total CK: 524 U/L — ABNORMAL HIGH (ref 38–234)

## 2015-08-21 LAB — TYPE AND SCREEN
ABO/RH(D): O POS
Antibody Screen: NEGATIVE
UNIT DIVISION: 0
UNIT DIVISION: 0

## 2015-08-21 LAB — COMPREHENSIVE METABOLIC PANEL
ALK PHOS: 181 U/L — AB (ref 38–126)
ALT: 159 U/L — ABNORMAL HIGH (ref 14–54)
ALT: 152 U/L — AB (ref 14–54)
ANION GAP: 13 (ref 5–15)
ANION GAP: 11 (ref 5–15)
AST: 307 U/L — ABNORMAL HIGH (ref 15–41)
AST: 348 U/L — ABNORMAL HIGH (ref 15–41)
Albumin: 3.5 g/dL (ref 3.5–5.0)
Albumin: 3.3 g/dL — ABNORMAL LOW (ref 3.5–5.0)
Alkaline Phosphatase: 172 U/L — ABNORMAL HIGH (ref 38–126)
BILIRUBIN TOTAL: 0.7 mg/dL (ref 0.3–1.2)
BUN: 41 mg/dL — ABNORMAL HIGH (ref 6–20)
BUN: 38 mg/dL — ABNORMAL HIGH (ref 6–20)
CALCIUM: 9.6 mg/dL (ref 8.9–10.3)
CHLORIDE: 108 mmol/L (ref 101–111)
CO2: 20 mmol/L — ABNORMAL LOW (ref 22–32)
CO2: 16 mmol/L — AB (ref 22–32)
CREATININE: 1.7 mg/dL — AB (ref 0.44–1.00)
CREATININE: 1.27 mg/dL — AB (ref 0.44–1.00)
Calcium: 8.9 mg/dL (ref 8.9–10.3)
Chloride: 104 mmol/L (ref 101–111)
GFR calc non Af Amer: 29 mL/min — ABNORMAL LOW (ref >60)
GFR calc non Af Amer: 41 mL/min — ABNORMAL LOW (ref >60)
GFR, EST AFRICAN AMERICAN: 33 mL/min — AB (ref >60)
GFR, EST AFRICAN AMERICAN: 47 mL/min — AB (ref >60)
Glucose, Bld: 168 mg/dL — ABNORMAL HIGH (ref 65–99)
Glucose, Bld: 266 mg/dL — ABNORMAL HIGH (ref 65–99)
Potassium: 4.1 mmol/L (ref 3.5–5.1)
Potassium: 3.5 mmol/L (ref 3.5–5.1)
SODIUM: 135 mmol/L (ref 135–145)
Sodium: 137 mmol/L (ref 135–145)
TOTAL PROTEIN: 6 g/dL — AB (ref 6.5–8.1)
Total Bilirubin: 0.8 mg/dL (ref 0.3–1.2)
Total Protein: 5.2 g/dL — ABNORMAL LOW (ref 6.5–8.1)

## 2015-08-21 LAB — TROPONIN I
TROPONIN I: 0.06 ng/mL — AB (ref <0.031)
TROPONIN I: 0.5 ng/mL — AB (ref <0.031)
Troponin I: 1.7 ng/mL (ref <0.031)
Troponin I: 4.35 ng/mL (ref <0.031)

## 2015-08-21 LAB — ABO/RH: ABO/RH(D): O POS

## 2015-08-21 LAB — CBC
HCT: 35.2 % — ABNORMAL LOW (ref 36.0–46.0)
HEMOGLOBIN: 11.4 g/dL — AB (ref 12.0–15.0)
MCH: 30.6 pg (ref 26.0–34.0)
MCHC: 32.4 g/dL (ref 30.0–36.0)
MCV: 94.6 fL (ref 78.0–100.0)
PLATELETS: 215 10*3/uL (ref 150–400)
RBC: 3.72 MIL/uL — AB (ref 3.87–5.11)
RDW: 13.6 % (ref 11.5–15.5)
WBC: 9.3 10*3/uL (ref 4.0–10.5)

## 2015-08-21 LAB — APTT
APTT: 26 s (ref 24–37)
APTT: 53 s — AB (ref 24–37)
aPTT: 25 seconds (ref 24–37)
aPTT: 57 seconds — ABNORMAL HIGH (ref 24–37)

## 2015-08-21 LAB — BLOOD PRODUCT ORDER (VERBAL) VERIFICATION

## 2015-08-21 LAB — I-STAT ARTERIAL BLOOD GAS, ED
ACID-BASE DEFICIT: 4 mmol/L — AB (ref 0.0–2.0)
BICARBONATE: 22.1 meq/L (ref 20.0–24.0)
O2 Saturation: 100 %
PH ART: 7.295 — AB (ref 7.350–7.450)
TCO2: 23 mmol/L (ref 0–100)
pCO2 arterial: 45.4 mmHg — ABNORMAL HIGH (ref 35.0–45.0)
pO2, Arterial: 332 mmHg — ABNORMAL HIGH (ref 80.0–100.0)

## 2015-08-21 LAB — MRSA PCR SCREENING: MRSA BY PCR: NEGATIVE

## 2015-08-21 LAB — LIPID PANEL
Cholesterol: 167 mg/dL (ref 0–200)
HDL: 70 mg/dL (ref >40)
LDL CALC: 80 mg/dL (ref 0–99)
TRIGLYCERIDES: 86 mg/dL (ref <150)
Total CHOL/HDL Ratio: 2.4 RATIO
VLDL: 17 mg/dL (ref 0–40)

## 2015-08-21 LAB — PROTIME-INR
INR: 1.25 (ref 0.00–1.49)
INR: 1.16 (ref 0.00–1.49)
INR: 1.18 (ref 0.00–1.49)
INR: 1.15 (ref 0.00–1.49)
PROTHROMBIN TIME: 15.9 s — AB (ref 11.6–15.2)
Prothrombin Time: 15 seconds (ref 11.6–15.2)
Prothrombin Time: 15.2 seconds (ref 11.6–15.2)
Prothrombin Time: 14.9 seconds (ref 11.6–15.2)

## 2015-08-21 LAB — I-STAT TROPONIN, ED: TROPONIN I, POC: 0.06 ng/mL (ref 0.00–0.08)

## 2015-08-21 LAB — CDS SEROLOGY

## 2015-08-21 LAB — HEPARIN LEVEL (UNFRACTIONATED): Heparin Unfractionated: 0.42 IU/mL (ref 0.30–0.70)

## 2015-08-21 LAB — ETHANOL

## 2015-08-21 SURGERY — LEFT HEART CATH AND CORONARY ANGIOGRAPHY
Anesthesia: LOCAL

## 2015-08-21 MED ORDER — ROCURONIUM BROMIDE 50 MG/5ML IV SOLN
INTRAVENOUS | Status: AC
Start: 2015-08-21 — End: 2015-08-21
  Administered 2015-08-21: 50 mg
  Filled 2015-08-21: qty 2

## 2015-08-21 MED ORDER — HEPARIN SODIUM (PORCINE) 1000 UNIT/ML IJ SOLN
INTRAMUSCULAR | Status: AC
  Filled 2015-08-21: qty 1

## 2015-08-21 MED ORDER — ASPIRIN 300 MG RE SUPP
300.0000 mg | RECTAL | Status: AC
  Administered 2015-08-21: 300 mg via RECTAL
  Filled 2015-08-21: qty 1

## 2015-08-21 MED ORDER — FENTANYL BOLUS VIA INFUSION
25.0000 ug | INTRAVENOUS | Status: DC | PRN
  Filled 2015-08-21: qty 25

## 2015-08-21 MED ORDER — FENTANYL CITRATE (PF) 100 MCG/2ML IJ SOLN
INTRAMUSCULAR | Status: AC
  Administered 2015-08-21: 100 ug
  Filled 2015-08-21: qty 2

## 2015-08-21 MED ORDER — NOREPINEPHRINE BITARTRATE 1 MG/ML IV SOLN
0.0000 ug/min | INTRAVENOUS | Status: DC
  Administered 2015-08-21: 17 ug/min via INTRAVENOUS
  Administered 2015-08-22: 39 ug/min via INTRAVENOUS
  Administered 2015-08-23 (×2): 40 ug/min via INTRAVENOUS
  Administered 2015-08-23: 30 ug/min via INTRAVENOUS
  Administered 2015-08-24: 24 ug/min via INTRAVENOUS
  Filled 2015-08-21 (×8): qty 16

## 2015-08-21 MED ORDER — HEPARIN (PORCINE) IN NACL 2-0.9 UNIT/ML-% IJ SOLN
INTRAMUSCULAR | Status: AC
  Filled 2015-08-21: qty 1000

## 2015-08-21 MED ORDER — ANTISEPTIC ORAL RINSE SOLUTION (CORINZ): 7.0000 mL | Freq: Four times a day (QID) | OROMUCOSAL | Status: DC

## 2015-08-21 MED ORDER — MIDAZOLAM HCL 2 MG/2ML IJ SOLN: 1.0000 mg | Freq: Once | INTRAMUSCULAR | Status: DC

## 2015-08-21 MED ORDER — SODIUM CHLORIDE 0.9 % IV SOLN: 2000.0000 mL | Freq: Once | INTRAVENOUS | Status: DC

## 2015-08-21 MED ORDER — HEPARIN (PORCINE) IN NACL 100-0.45 UNIT/ML-% IJ SOLN
700.0000 [IU]/h | INTRAMUSCULAR | Status: DC
  Administered 2015-08-21 – 2015-08-23 (×2): 700 [IU]/h via INTRAVENOUS
  Filled 2015-08-21 (×2): qty 250

## 2015-08-21 MED ORDER — ACETAMINOPHEN 325 MG PO TABS: 650.0000 mg | ORAL_TABLET | ORAL | Status: DC | PRN

## 2015-08-21 MED ORDER — SODIUM CHLORIDE 0.9 % WEIGHT BASED INFUSION
1.0000 mL/kg/h | INTRAVENOUS | Status: AC
  Administered 2015-08-21: 1 mL/kg/h via INTRAVENOUS

## 2015-08-21 MED ORDER — FENTANYL CITRATE (PF) 100 MCG/2ML IJ SOLN
50.0000 ug | Freq: Once | INTRAMUSCULAR | Status: AC
  Administered 2015-08-21: 50 ug via INTRAVENOUS

## 2015-08-21 MED ORDER — VERAPAMIL HCL 2.5 MG/ML IV SOLN
INTRAVENOUS | Status: AC
  Filled 2015-08-21: qty 2

## 2015-08-21 MED ORDER — SODIUM CHLORIDE 0.9 % IJ SOLN: 3.0000 mL | Freq: Two times a day (BID) | INTRAMUSCULAR | Status: DC

## 2015-08-21 MED ORDER — SODIUM CHLORIDE 0.9 % IV SOLN
INTRAVENOUS | Status: DC
  Administered 2015-08-21: 1.9 [IU]/h via INTRAVENOUS
  Filled 2015-08-21: qty 2.5

## 2015-08-21 MED ORDER — LIDOCAINE HCL (PF) 1 % IJ SOLN
INTRAMUSCULAR | Status: AC
  Filled 2015-08-21: qty 30

## 2015-08-21 MED ORDER — MIDAZOLAM HCL 2 MG/2ML IJ SOLN
INTRAMUSCULAR | Status: AC
  Filled 2015-08-21: qty 4

## 2015-08-21 MED ORDER — DEXTROSE 50 % IV SOLN
25.0000 mL | INTRAVENOUS | Status: DC | PRN
  Administered 2015-08-23 – 2015-08-24 (×3): 25 mL via INTRAVENOUS
  Filled 2015-08-21 (×2): qty 50

## 2015-08-21 MED ORDER — SODIUM CHLORIDE 0.9 % IV SOLN
2000.0000 mL | Freq: Once | INTRAVENOUS | Status: AC
  Administered 2015-08-21: 2000 mL via INTRAVENOUS

## 2015-08-21 MED ORDER — NOREPINEPHRINE BITARTRATE 1 MG/ML IV SOLN
0.0000 ug/min | INTRAVENOUS | Status: DC
  Administered 2015-08-21 (×2): 5 ug/min via INTRAVENOUS
  Filled 2015-08-21 (×2): qty 4

## 2015-08-21 MED ORDER — MIDAZOLAM BOLUS VIA INFUSION
1.0000 mg | INTRAVENOUS | Status: DC | PRN
Start: 2015-08-21 — End: 2015-08-23
  Filled 2015-08-21: qty 1

## 2015-08-21 MED ORDER — SODIUM CHLORIDE 0.9 % IV SOLN: 250.0000 mL | INTRAVENOUS | Status: DC | PRN

## 2015-08-21 MED ORDER — IOHEXOL 350 MG/ML SOLN
INTRAVENOUS | Status: DC | PRN
  Administered 2015-08-21: 70 mL via INTRA_ARTERIAL

## 2015-08-21 MED ORDER — LIDOCAINE HCL (CARDIAC) 20 MG/ML IV SOLN
INTRAVENOUS | Status: AC
  Filled 2015-08-21: qty 5

## 2015-08-21 MED ORDER — LIDOCAINE HCL (PF) 1 % IJ SOLN
INTRAMUSCULAR | Status: DC | PRN
  Administered 2015-08-21: 5 mL via SUBCUTANEOUS

## 2015-08-21 MED ORDER — MIDAZOLAM HCL 2 MG/2ML IJ SOLN
4.0000 mg | Freq: Once | INTRAMUSCULAR | Status: AC
  Administered 2015-08-21: 4 mg via INTRAVENOUS

## 2015-08-21 MED ORDER — ARTIFICIAL TEARS OP OINT
1.0000 "application " | TOPICAL_OINTMENT | Freq: Three times a day (TID) | OPHTHALMIC | Status: DC
  Administered 2015-08-21 – 2015-08-23 (×6): 1 via OPHTHALMIC
  Filled 2015-08-21 (×2): qty 3.5

## 2015-08-21 MED ORDER — ETOMIDATE 2 MG/ML IV SOLN
INTRAVENOUS | Status: AC
  Filled 2015-08-21: qty 20

## 2015-08-21 MED ORDER — ONDANSETRON HCL 4 MG/2ML IJ SOLN
4.0000 mg | Freq: Four times a day (QID) | INTRAMUSCULAR | Status: DC | PRN
  Administered 2015-08-24 – 2015-08-28 (×4): 4 mg via INTRAVENOUS
  Filled 2015-08-21 (×4): qty 2

## 2015-08-21 MED ORDER — CISATRACURIUM BOLUS VIA INFUSION
0.1000 mg/kg | Freq: Once | INTRAVENOUS | Status: AC
  Administered 2015-08-21: 6.8 mg via INTRAVENOUS
  Filled 2015-08-21: qty 7

## 2015-08-21 MED ORDER — NITROGLYCERIN 1 MG/10 ML FOR IR/CATH LAB
INTRA_ARTERIAL | Status: AC
  Filled 2015-08-21: qty 10

## 2015-08-21 MED ORDER — SODIUM CHLORIDE 0.9 % IV SOLN
1.0000 ug/kg/min | INTRAVENOUS | Status: DC
  Administered 2015-08-21 – 2015-08-22 (×2): 1.5 ug/kg/min via INTRAVENOUS
  Filled 2015-08-21 (×2): qty 20

## 2015-08-21 MED ORDER — SUCCINYLCHOLINE CHLORIDE 20 MG/ML IJ SOLN
INTRAMUSCULAR | Status: AC
  Filled 2015-08-21: qty 1

## 2015-08-21 MED ORDER — MIDAZOLAM HCL 2 MG/2ML IJ SOLN
INTRAMUSCULAR | Status: AC
  Administered 2015-08-21: 2 mg
  Filled 2015-08-21: qty 2

## 2015-08-21 MED ORDER — CISATRACURIUM BOLUS VIA INFUSION
INTRAVENOUS | Status: DC | PRN
  Administered 2015-08-21: 6.8 mg via INTRAVENOUS

## 2015-08-21 MED ORDER — INSULIN REGULAR BOLUS VIA INFUSION
0.0000 [IU] | Freq: Three times a day (TID) | INTRAVENOUS | Status: DC
  Filled 2015-08-21: qty 10

## 2015-08-21 MED ORDER — SODIUM CHLORIDE 0.9 % IV SOLN
INTRAVENOUS | Status: DC | PRN
  Administered 2015-08-21: 150 mL/h via INTRAVENOUS

## 2015-08-21 MED ORDER — CHLORHEXIDINE GLUCONATE 0.12% ORAL RINSE (MEDLINE KIT)
15.0000 mL | Freq: Two times a day (BID) | OROMUCOSAL | Status: DC
  Administered 2015-08-21 – 2015-08-25 (×9): 15 mL via OROMUCOSAL

## 2015-08-21 MED ORDER — CISATRACURIUM BESYLATE (PF) 200 MG/20ML IV SOLN
200.0000 mg | INTRAVENOUS | Status: DC | PRN
  Administered 2015-08-21: 1.5 ug/kg/min via INTRAVENOUS

## 2015-08-21 MED ORDER — SODIUM CHLORIDE 0.9 % IV SOLN
1.0000 mg/h | INTRAVENOUS | Status: DC
  Administered 2015-08-21: 2 mg/h via INTRAVENOUS
  Administered 2015-08-21 (×2): 6 mg/h via INTRAVENOUS
  Administered 2015-08-22 (×2): 4 mg/h via INTRAVENOUS
  Administered 2015-08-23: 1 mg/h via INTRAVENOUS
  Filled 2015-08-21 (×6): qty 10

## 2015-08-21 MED ORDER — POTASSIUM CHLORIDE 10 MEQ/50ML IV SOLN
10.0000 meq | INTRAVENOUS | Status: AC
  Administered 2015-08-21 (×4): 10 meq via INTRAVENOUS
  Filled 2015-08-21 (×4): qty 50

## 2015-08-21 MED ORDER — CHLORHEXIDINE GLUCONATE 0.12% ORAL RINSE (MEDLINE KIT)
15.0000 mL | Freq: Two times a day (BID) | OROMUCOSAL | Status: DC
  Administered 2015-08-21: 15 mL via OROMUCOSAL

## 2015-08-21 MED ORDER — ANTISEPTIC ORAL RINSE SOLUTION (CORINZ)
7.0000 mL | OROMUCOSAL | Status: DC
  Administered 2015-08-21 – 2015-08-26 (×49): 7 mL via OROMUCOSAL

## 2015-08-21 MED ORDER — SODIUM CHLORIDE 0.9 % IV SOLN
25.0000 ug/h | INTRAVENOUS | Status: DC
  Administered 2015-08-21: 200 ug/h via INTRAVENOUS
  Administered 2015-08-21: 75 ug/h via INTRAVENOUS
  Administered 2015-08-22: 150 ug/h via INTRAVENOUS
  Administered 2015-08-23: 25 ug/h via INTRAVENOUS
  Filled 2015-08-21 (×4): qty 50

## 2015-08-21 MED ORDER — DEXTROSE-NACL 5-0.45 % IV SOLN
INTRAVENOUS | Status: DC
  Administered 2015-08-21 – 2015-08-22 (×2): via INTRAVENOUS

## 2015-08-21 MED ORDER — SODIUM CHLORIDE 0.9 % IV SOLN
INTRAVENOUS | Status: DC
  Administered 2015-08-21 – 2015-08-23 (×2): via INTRAVENOUS
  Administered 2015-08-24: 10 mL/h via INTRAVENOUS
  Administered 2015-08-24: 09:00:00 via INTRAVENOUS

## 2015-08-21 MED ORDER — ASPIRIN EC 325 MG PO TBEC: 325.0000 mg | DELAYED_RELEASE_TABLET | Freq: Every day | ORAL | Status: DC

## 2015-08-21 MED ORDER — HEPARIN SODIUM (PORCINE) 1000 UNIT/ML IJ SOLN
INTRAMUSCULAR | Status: DC | PRN
  Administered 2015-08-21: 4000 [IU] via INTRAVENOUS

## 2015-08-21 MED ORDER — FENTANYL CITRATE (PF) 100 MCG/2ML IJ SOLN: 50.0000 ug | Freq: Once | INTRAMUSCULAR | Status: DC

## 2015-08-21 MED ORDER — SODIUM CHLORIDE 0.9 % IJ SOLN: 3.0000 mL | INTRAMUSCULAR | Status: DC | PRN

## 2015-08-21 MED ORDER — PANTOPRAZOLE SODIUM 40 MG IV SOLR
40.0000 mg | Freq: Every day | INTRAVENOUS | Status: DC
  Administered 2015-08-21 – 2015-08-22 (×2): 40 mg via INTRAVENOUS
  Filled 2015-08-21 (×2): qty 40

## 2015-08-21 MED ORDER — NITROGLYCERIN IN D5W 200-5 MCG/ML-% IV SOLN
10.0000 ug/min | INTRAVENOUS | Status: DC
Start: 2015-08-21 — End: 2015-08-24

## 2015-08-21 MED ORDER — CISATRACURIUM BOLUS VIA INFUSION
0.0500 mg/kg | INTRAVENOUS | Status: DC | PRN
  Filled 2015-08-21: qty 4

## 2015-08-21 MED ORDER — FENTANYL CITRATE (PF) 100 MCG/2ML IJ SOLN
INTRAMUSCULAR | Status: AC
  Filled 2015-08-21: qty 2

## 2015-08-21 SURGICAL SUPPLY — 15 items
BALLN LINEAR 7.5FR IABP 40CC (BALLOONS) ×2
BALLOON LINEAR 7.5FR IABP 40CC (BALLOONS) IMPLANT
CATH INFINITI 5FR ANG PIGTAIL (CATHETERS) ×1 IMPLANT
CATH INFINITI 5FR JL4 (CATHETERS) ×1 IMPLANT
CATH INFINITI JR4 5F (CATHETERS) ×2 IMPLANT
DEVICE RAD COMP TR BAND LRG (VASCULAR PRODUCTS) ×2 IMPLANT
DEVICE SECURE STATLOCK IABP (MISCELLANEOUS) ×2 IMPLANT
GLIDESHEATH SLEND A-KIT 6F 22G (SHEATH) ×2 IMPLANT
KIT HEART LEFT (KITS) ×2 IMPLANT
PACK CARDIAC CATHETERIZATION (CUSTOM PROCEDURE TRAY) ×2 IMPLANT
SHEATH PINNACLE 6F 10CM (SHEATH) ×1 IMPLANT
TRANSDUCER W/STOPCOCK (MISCELLANEOUS) ×2 IMPLANT
TUBING CIL FLEX 10 FLL-RA (TUBING) ×2 IMPLANT
WIRE EMERALD 3MM-J .035X150CM (WIRE) ×1 IMPLANT
WIRE SAFE-T 1.5MM-J .035X260CM (WIRE) ×3 IMPLANT

## 2015-08-21 NOTE — Procedures (Signed)
Central Venous Catheter Insertion Procedure Note Ann Carroll [id number] 08/22/1941  Procedure: Insertion of Central Venous Catheter Indications: Assessment of intravascular volume, Drug and/or fluid administration and Frequent blood sampling  Procedure Details Consent: Unable to obtain consent because of emergent medical necessity. Time Out: Verified patient identification, verified procedure, site/side was marked, verified correct patient position, special equipment/implants available, medications/allergies/relevent history reviewed, required imaging and test results available.  Performed  Maximum sterile technique was used including antiseptics, cap, gloves, gown, hand hygiene, mask and sheet. Skin prep: Chlorhexidine; local anesthetic administered A antimicrobial bonded/coated triple lumen catheter was placed in the left internal jugular vein using the Seldinger technique. Ultrasound guidance used.Yes.   Catheter placed to 20 cm. Blood aspirated via all 3 ports and then flushed x 3. Line sutured x 2 and dressing applied.  Evaluation Blood flow good Complications: No apparent complications Patient did tolerate procedure well. Chest X-ray ordered to verify placement.  CXR: pending.  Brett Canales [name] ACNP Adolph Pollack PCCM Pager [phone] till 3 pm If no answer page [phone] 09-09-15, 11:09 AM

## 2015-08-21 NOTE — ED Notes (Addendum)
Pt was transported to CT with this rn, jonathan rn and jeff rt. Pt monitored and returned to room. Critical care at bedside upon return for central line placement. PT noted to move both arms and head.
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
## 2015-08-21 NOTE — Progress Notes (Signed)
Notified Dr. Jamison Neighbor of CBG 263. Orders received to begin insulin drip and glucostabilizer.  Also clarified with MD that the goal Temp is 33 degrees.  Will continue to monitor.
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
## 2015-08-21 NOTE — Progress Notes (Signed)
ANTICOAGULATION CONSULT NOTE - Initial Consult  Pharmacy Consult for Heparin  Indication: IABP  No Known Allergies  Patient Measurements: Height:  (175.3 cm) Weight: 150 lb (68.04 kg) IBW/kg (Calculated) : 66.2   Vital Signs: Temp: 97.6 F (36.4 C) (09/09 0939) Temp Source: Rectal (09/09 0939) BP: 171/110 mmHg (09/09 1324) Pulse Rate: 0 (09/09 1348)  Labs:  Recent Labs  08/24/2015 0920 09/08/2015 1300  HGB 11.4* 11.7*  HCT 35.2* 34.9*  PLT 215 226  APTT 26 25  LABPROT 15.9* 15.0  INR 1.25 1.16  CREATININE 1.70* 1.27*  CKTOTAL  --  524*  CKMB  --  26.3*  TROPONINI 0.06* 0.50*    Estimated Creatinine Clearance: 41.2 mL/min (by C-G formula based on Cr of 1.27).   Medical History: No past medical history on file.    Assessment: 74yof admitted after VF arrest now hypothermia protocol.  She was STEMI and  taken to cath lab found to have 3V CAD and TCTS consult.  IABP placed and heparin drip to start 700 uts/hr.  CBC stable.  Goal of Therapy:  Heparin level 0.2-0.5 units/ml Monitor platelets by anticoagulation protocol: Yes   Plan:  Heparin drip 700 uts/hr Draw HL 6hr after start  Daily CBC, HL  Leota Sauers Pharm.D. CPP, BCPS Clinical Pharmacist [phone] 09/04/2015 2:54 PM

## 2015-08-21 NOTE — Interval H&P Note (Signed)
Cath Lab Visit (complete for each Cath Lab visit)  Clinical Evaluation Leading to the Procedure:   ACS: Yes.    Non-ACS:    Anginal Classification: No Symptoms  Anti-ischemic medical therapy: No Therapy  Non-Invasive Test Results: No non-invasive testing performed  Prior CABG: No previous CABG  The patient is post ventricular fibrillation arrest.    History and Physical Interval Note:  2015-09-12 11:53 AM  Ann Carroll  has presented today for surgery, with the diagnosis of Post Arrest  The various methods of treatment have been discussed with the patient and family. After consideration of risks, benefits and other options for treatment, the patient has consented to  Procedure(s): Left Heart Cath and Coronary Angiography (N/A) as a surgical intervention .  The patient's history has been reviewed, patient examined, no change in status, stable for surgery.  I have reviewed the patient's chart and labs.  Questions were answered to the patient's satisfaction.     Lesleigh Noe
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
## 2015-08-21 NOTE — Progress Notes (Signed)
CRITICAL VALUE ALERT  Critical value received:  Trop 0.5  Date of notification:  08/19/2015  Time of notification:  1402  Critical value read back:Yes.    Nurse who received alert:  Dayna Barker, RN  MD notified (1st page):  Dr. Katrinka Blazing  Time of first page:  1405  MD notified (2nd page):  Time of second page:  Responding MD:  Dr. Katrinka Blazing  Time MD responded:  [phone]
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
## 2015-08-21 NOTE — ED Notes (Signed)
Per CCM initiate 33 degrees.
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
## 2015-08-21 NOTE — Progress Notes (Signed)
   09/03/2015 0900  Clinical Encounter Type  Visited With Patient not available  Visit Type Trauma  Referral From Nurse  Consult/Referral To Chaplain  Recommendations (Chaplain can return upon arrival of family)  Stress Factors  Patient Stress Factors None identified  Family Stress Factors None identified  Chaplain responded to Trauma code page; Chaplain found out the story; there is no identified family but family has been notified; Chaplain can return upon request and when family arrives

## 2015-08-21 NOTE — ED Notes (Signed)
Artic sun pads placed by lindsey rn and taylor moon.

## 2015-08-21 NOTE — Progress Notes (Signed)
Spoke with Dr. Dellie Catholic with [facility] who looked at chest xray.  Per Dr. Dellie Catholic central line is appropriately placed and can be used.
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
## 2015-08-21 NOTE — Progress Notes (Signed)
Patient's urine output decreasing. Output down to 15-62ml/ hr. MD notified, no new orders. Will continue to monitor.

## 2015-08-21 NOTE — Progress Notes (Signed)
eLink Physician-Brief Progress Note Patient Name: Ann Carroll DOB: [birth year] MRN: [id number]   Date of Service  09/07/2015  HPI/Events of Note  Hypokalemia - K+ = 3.0.  eICU Interventions  Replete K+ and re-check BMP.     Intervention Category Intermediate Interventions: Electrolyte abnormality - evaluation and management  Sommer,Steven Eugene 08/19/2015, 6:02 PM

## 2015-08-21 NOTE — Care Management Note (Signed)
Case Management Note  Patient Details  Name: Ann Carroll MRN: [id number] Date of Birth: 1941/12/02  Subjective/Objective:      Adm w cardiac arrest, vent              Action/Plan:lives w fam   Expected Discharge Date:                  Expected Discharge Plan:     In-House Referral:     Discharge planning Services     Post Acute Care Choice:    Choice offered to:     DME Arranged:    DME Agency:     HH Arranged:    HH Agency:     Status of Service:     Medicare Important Message Given:    Date Medicare IM Given:    Medicare IM give by:    Date Additional Medicare IM Given:    Additional Medicare Important Message give by:     If discussed at Long Length of Stay Meetings, dates discussed:    Additional Comments: ur review done  Hanley Hays, RN 25-Aug-2015, 1:52 PM

## 2015-08-21 NOTE — Progress Notes (Signed)
EEG completed, results pending. 

## 2015-08-21 NOTE — ED Notes (Signed)
Pt transported to cath lab by this RN, rt and dalton NT. Pt on zoll. Cath lab made aware of delay due to central line placement in ED. States to bring pt up. Aware that xray has not been completed for confirmation placement.

## 2015-08-21 NOTE — Progress Notes (Signed)
Chaplain checked on Pt. And will waiting for family to come into the ED. Chaplain will also contact family. Please page if needed.

## 2015-08-22 ENCOUNTER — Inpatient Hospital Stay (HOSPITAL_COMMUNITY): Payer: Medicare Other

## 2015-08-22 DIAGNOSIS — I214 Non-ST elevation (NSTEMI) myocardial infarction: Secondary | ICD-10-CM

## 2015-08-22 DIAGNOSIS — J9601 Acute respiratory failure with hypoxia: Secondary | ICD-10-CM

## 2015-08-22 LAB — GLUCOSE, CAPILLARY
GLUCOSE-CAPILLARY: 179 mg/dL — AB (ref 65–99)
GLUCOSE-CAPILLARY: 155 mg/dL — AB (ref 65–99)
GLUCOSE-CAPILLARY: 175 mg/dL — AB (ref 65–99)
GLUCOSE-CAPILLARY: 176 mg/dL — AB (ref 65–99)
GLUCOSE-CAPILLARY: 143 mg/dL — AB (ref 65–99)
GLUCOSE-CAPILLARY: 178 mg/dL — AB (ref 65–99)
GLUCOSE-CAPILLARY: 116 mg/dL — AB (ref 65–99)
Glucose-Capillary: 153 mg/dL — ABNORMAL HIGH (ref 65–99)
Glucose-Capillary: 166 mg/dL — ABNORMAL HIGH (ref 65–99)
Glucose-Capillary: 172 mg/dL — ABNORMAL HIGH (ref 65–99)
Glucose-Capillary: 149 mg/dL — ABNORMAL HIGH (ref 65–99)
Glucose-Capillary: 150 mg/dL — ABNORMAL HIGH (ref 65–99)
Glucose-Capillary: 161 mg/dL — ABNORMAL HIGH (ref 65–99)
Glucose-Capillary: 172 mg/dL — ABNORMAL HIGH (ref 65–99)
Glucose-Capillary: 204 mg/dL — ABNORMAL HIGH (ref 65–99)
Glucose-Capillary: 91 mg/dL (ref 65–99)

## 2015-08-22 LAB — BASIC METABOLIC PANEL
ANION GAP: 10 (ref 5–15)
ANION GAP: 12 (ref 5–15)
ANION GAP: 10 (ref 5–15)
ANION GAP: 10 (ref 5–15)
Anion gap: 13 (ref 5–15)
Anion gap: 11 (ref 5–15)
BUN: 33 mg/dL — ABNORMAL HIGH (ref 6–20)
BUN: 35 mg/dL — AB (ref 6–20)
BUN: 35 mg/dL — AB (ref 6–20)
BUN: 36 mg/dL — AB (ref 6–20)
BUN: 38 mg/dL — ABNORMAL HIGH (ref 6–20)
BUN: 40 mg/dL — AB (ref 6–20)
CALCIUM: 9 mg/dL (ref 8.9–10.3)
CALCIUM: 8.7 mg/dL — AB (ref 8.9–10.3)
CALCIUM: 8.7 mg/dL — AB (ref 8.9–10.3)
CHLORIDE: 106 mmol/L (ref 101–111)
CHLORIDE: 105 mmol/L (ref 101–111)
CO2: 15 mmol/L — ABNORMAL LOW (ref 22–32)
CO2: 16 mmol/L — ABNORMAL LOW (ref 22–32)
CO2: 17 mmol/L — AB (ref 22–32)
CO2: 17 mmol/L — AB (ref 22–32)
CO2: 17 mmol/L — ABNORMAL LOW (ref 22–32)
CO2: 18 mmol/L — ABNORMAL LOW (ref 22–32)
CREATININE: 1.38 mg/dL — AB (ref 0.44–1.00)
Calcium: 8.7 mg/dL — ABNORMAL LOW (ref 8.9–10.3)
Calcium: 8.8 mg/dL — ABNORMAL LOW (ref 8.9–10.3)
Calcium: 8.7 mg/dL — ABNORMAL LOW (ref 8.9–10.3)
Chloride: 109 mmol/L (ref 101–111)
Chloride: 107 mmol/L (ref 101–111)
Chloride: 107 mmol/L (ref 101–111)
Chloride: 108 mmol/L (ref 101–111)
Creatinine, Ser: 1.22 mg/dL — ABNORMAL HIGH (ref 0.44–1.00)
Creatinine, Ser: 1.33 mg/dL — ABNORMAL HIGH (ref 0.44–1.00)
Creatinine, Ser: 1.38 mg/dL — ABNORMAL HIGH (ref 0.44–1.00)
Creatinine, Ser: 1.35 mg/dL — ABNORMAL HIGH (ref 0.44–1.00)
Creatinine, Ser: 1.35 mg/dL — ABNORMAL HIGH (ref 0.44–1.00)
GFR calc Af Amer: 45 mL/min — ABNORMAL LOW (ref >60)
GFR calc Af Amer: 43 mL/min — ABNORMAL LOW (ref >60)
GFR calc Af Amer: 43 mL/min — ABNORMAL LOW (ref >60)
GFR calc Af Amer: 44 mL/min — ABNORMAL LOW (ref >60)
GFR calc Af Amer: 44 mL/min — ABNORMAL LOW (ref >60)
GFR calc non Af Amer: 43 mL/min — ABNORMAL LOW (ref >60)
GFR calc non Af Amer: 37 mL/min — ABNORMAL LOW (ref >60)
GFR, EST AFRICAN AMERICAN: 50 mL/min — AB (ref >60)
GFR, EST NON AFRICAN AMERICAN: 39 mL/min — AB (ref >60)
GFR, EST NON AFRICAN AMERICAN: 37 mL/min — AB (ref >60)
GFR, EST NON AFRICAN AMERICAN: 38 mL/min — AB (ref >60)
GFR, EST NON AFRICAN AMERICAN: 38 mL/min — AB (ref >60)
GLUCOSE: 156 mg/dL — AB (ref 65–99)
GLUCOSE: 171 mg/dL — AB (ref 65–99)
GLUCOSE: 193 mg/dL — AB (ref 65–99)
GLUCOSE: 123 mg/dL — AB (ref 65–99)
GLUCOSE: 94 mg/dL (ref 65–99)
Glucose, Bld: 186 mg/dL — ABNORMAL HIGH (ref 65–99)
POTASSIUM: 4.2 mmol/L (ref 3.5–5.1)
POTASSIUM: 4.3 mmol/L (ref 3.5–5.1)
POTASSIUM: 4.7 mmol/L (ref 3.5–5.1)
Potassium: 4.6 mmol/L (ref 3.5–5.1)
Potassium: 4.4 mmol/L (ref 3.5–5.1)
Potassium: 4.1 mmol/L (ref 3.5–5.1)
SODIUM: 134 mmol/L — AB (ref 135–145)
SODIUM: 134 mmol/L — AB (ref 135–145)
SODIUM: 134 mmol/L — AB (ref 135–145)
SODIUM: 134 mmol/L — AB (ref 135–145)
SODIUM: 137 mmol/L (ref 135–145)
Sodium: 135 mmol/L (ref 135–145)

## 2015-08-22 LAB — CBC WITH DIFFERENTIAL/PLATELET
Basophils Absolute: 0 10*3/uL (ref 0.0–0.1)
Basophils Relative: 0 % (ref 0–1)
Eosinophils Absolute: 0 10*3/uL (ref 0.0–0.7)
Eosinophils Relative: 0 % (ref 0–5)
HEMATOCRIT: 36.3 % (ref 36.0–46.0)
HEMOGLOBIN: 12.1 g/dL (ref 12.0–15.0)
LYMPHS ABS: 1.9 10*3/uL (ref 0.7–4.0)
Lymphocytes Relative: 12 % (ref 12–46)
MCH: 30.4 pg (ref 26.0–34.0)
MCHC: 33.3 g/dL (ref 30.0–36.0)
MCV: 91.2 fL (ref 78.0–100.0)
Monocytes Absolute: 1.5 10*3/uL — ABNORMAL HIGH (ref 0.1–1.0)
Monocytes Relative: 10 % (ref 3–12)
NEUTROS ABS: 12.1 10*3/uL — AB (ref 1.7–7.7)
NEUTROS PCT: 78 % — AB (ref 43–77)
Platelets: 231 10*3/uL (ref 150–400)
RBC: 3.98 MIL/uL (ref 3.87–5.11)
RDW: 13.5 % (ref 11.5–15.5)
WBC: 15.5 10*3/uL — AB (ref 4.0–10.5)

## 2015-08-22 LAB — HEMOGLOBIN A1C
Hgb A1c MFr Bld: 6.7 % — ABNORMAL HIGH (ref 4.8–5.6)
Mean Plasma Glucose: 146 mg/dL

## 2015-08-22 LAB — HEPARIN LEVEL (UNFRACTIONATED)
Heparin Unfractionated: 0.54 IU/mL (ref 0.30–0.70)
Heparin Unfractionated: 0.5 IU/mL (ref 0.30–0.70)
Heparin Unfractionated: 0.29 IU/mL — ABNORMAL LOW (ref 0.30–0.70)

## 2015-08-22 LAB — HEPATIC FUNCTION PANEL
ALBUMIN: 3.1 g/dL — AB (ref 3.5–5.0)
ALK PHOS: 164 U/L — AB (ref 38–126)
ALT: 128 U/L — AB (ref 14–54)
AST: 247 U/L — AB (ref 15–41)
BILIRUBIN TOTAL: 0.9 mg/dL (ref 0.3–1.2)
Bilirubin, Direct: <0.1 mg/dL — ABNORMAL LOW (ref 0.1–0.5)
Total Protein: 5.9 g/dL — ABNORMAL LOW (ref 6.5–8.1)

## 2015-08-22 LAB — MAGNESIUM: MAGNESIUM: 1.8 mg/dL (ref 1.7–2.4)

## 2015-08-22 LAB — TROPONIN I: Troponin I: 5.67 ng/mL (ref <0.031)

## 2015-08-22 LAB — PHOSPHORUS: Phosphorus: 3 mg/dL (ref 2.5–4.6)

## 2015-08-22 MED ORDER — LEVOTHYROXINE SODIUM 100 MCG IV SOLR
75.0000 ug | Freq: Every day | INTRAVENOUS | Status: DC
  Administered 2015-08-22: 75 ug via INTRAVENOUS
  Filled 2015-08-22: qty 5

## 2015-08-22 MED ORDER — INSULIN ASPART 100 UNIT/ML ~~LOC~~ SOLN
2.0000 [IU] | SUBCUTANEOUS | Status: DC
  Administered 2015-08-22: 6 [IU] via SUBCUTANEOUS
  Administered 2015-08-23: 4 [IU] via SUBCUTANEOUS

## 2015-08-22 MED ORDER — ASPIRIN 325 MG PO TABS
325.0000 mg | ORAL_TABLET | Freq: Every day | ORAL | Status: DC
  Administered 2015-08-22: 325 mg via ORAL
  Filled 2015-08-22: qty 1

## 2015-08-22 MED ORDER — ACETAMINOPHEN 160 MG/5ML PO SOLN: 650.0000 mg | Freq: Four times a day (QID) | ORAL | Status: DC | PRN

## 2015-08-22 MED ORDER — VASOPRESSIN 20 UNIT/ML IV SOLN
0.0300 [IU]/min | INTRAVENOUS | Status: DC
  Administered 2015-08-22 – 2015-08-24 (×3): 0.03 [IU]/min via INTRAVENOUS
  Filled 2015-08-22 (×3): qty 2

## 2015-08-22 MED ORDER — SODIUM CHLORIDE 0.9 % IV BOLUS (SEPSIS)
500.0000 mL | Freq: Once | INTRAVENOUS | Status: AC
  Administered 2015-08-22: 500 mL via INTRAVENOUS

## 2015-08-22 MED ORDER — MAGNESIUM SULFATE 2 GM/50ML IV SOLN
2.0000 g | Freq: Once | INTRAVENOUS | Status: AC
  Administered 2015-08-22: 2 g via INTRAVENOUS
  Filled 2015-08-22: qty 50

## 2015-08-22 MED ORDER — INSULIN GLARGINE 100 UNIT/ML ~~LOC~~ SOLN
10.0000 [IU] | SUBCUTANEOUS | Status: DC
  Administered 2015-08-22 – 2015-08-23 (×2): 10 [IU] via SUBCUTANEOUS
  Filled 2015-08-22 (×3): qty 0.1
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
## 2015-08-22 NOTE — Progress Notes (Signed)
ANTICOAGULATION CONSULT NOTE - Follow Up Consult  Pharmacy Consult for heparin Indication: IABP on hypothermia  Labs:  Recent Labs  08/18/2015 0920 08/27/2015 1300 09/07/2015 1539 08/20/2015 1600 08/24/2015 1632 08/31/2015 1743 08/19/2015 2147 08/25/2015 2148 09/06/2015 2149 08/22/15 0015 08/22/15 0449 08/22/15 0450  HGB 11.4* 11.7*  --  12.2  --  10.5*  --   --   --   --  12.1  --   HCT 35.2* 34.9*  --  36.0  --  31.0*  --   --   --   --  36.3  --   PLT 215 226  --   --   --   --   --   --   --   --  231  --   APTT 26 25  --   --  57*  --   --  53*  --   --   --   --   LABPROT 15.9* 15.0  --   --  15.2  --   --  14.9  --   --   --   --   INR 1.25 1.16  --   --  1.18  --   --  1.15  --   --   --   --   HEPARINUNFRC  --   --   --   --   --   --  0.42  --   --   --   --  0.54  CREATININE 1.70* 1.27*  --  1.00 1.31* 0.90  --   --  1.15* 1.22*  --   --   CKTOTAL  --  524*  --   --   --   --   --   --   --   --   --   --   CKMB  --  26.3*  --   --   --   --   --   --   --   --   --   --   TROPONINI 0.06* 0.50* 1.70*  --   --   --   --  4.35*  --   --   --   --      Assessment: 74yo female now supratherapeutic on heparin after one level at goal.  Goal of Therapy:  Heparin level 0.2-0.5 units/ml   Plan:  Will decrease heparin gtt by 1-2 units/kg/hr to 600 units/hr and check level in ~6hr; pt to start rewarming ~1330 this afternoon.  Vernard Gambles, PharmD, BCPS  08/22/2015,5:30 AM

## 2015-08-22 NOTE — Progress Notes (Signed)
Dr. Craige Cotta notified that Glucostabilizer had held insulin drip for 2 hours.  Orders given to transition to stage 3 of icu hyperglycemic protocol.  Orders also received to continue D5 1/2 NS at 10.  Will continue to monitor.

## 2015-08-22 NOTE — Progress Notes (Signed)
ANTICOAGULATION CONSULT NOTE - Follow Up Consult  Pharmacy Consult for hepqrin Indication: IABP on hypothermia  Labs:  Recent Labs  2015-08-24 0920 08-24-15 1300 24-Aug-2015 1539 Aug 24, 2015 1600 2015-08-24 1632 August 24, 2015 1743  24-Aug-2015 2148  08/22/15 0449 08/22/15 0450  08/22/15 1151 08/22/15 1152 08/22/15 1600 08/22/15 1950 08/22/15 2210  HGB 11.4* 11.7*  --  12.2  --  10.5*  --   --   --  12.1  --   --   --   --   --   --   --   HCT 35.2* 34.9*  --  36.0  --  31.0*  --   --   --  36.3  --   --   --   --   --   --   --   PLT 215 226  --   --   --   --   --   --   --  231  --   --   --   --   --   --   --   APTT 26 25  --   --  57*  --   --  53*  --   --   --   --   --   --   --   --   --   LABPROT 15.9* 15.0  --   --  15.2  --   --  14.9  --   --   --   --   --   --   --   --   --   INR 1.25 1.16  --   --  1.18  --   --  1.15  --   --   --   --   --   --   --   --   --   HEPARINUNFRC  --   --   --   --   --   --   < >  --   --   --  0.54  --   --  0.50  --   --  0.29*  CREATININE 1.70* 1.27*  --  1.00 1.31* 0.90  --   --   < >  --   --   < > 1.38*  --  1.35* 1.35*  --   CKTOTAL  --  524*  --   --   --   --   --   --   --   --   --   --   --   --   --   --   --   CKMB  --  26.3*  --   --   --   --   --   --   --   --   --   --   --   --   --   --   --   TROPONINI 0.06* 0.50* 1.70*  --   --   --   --  4.35*  --  5.67*  --   --   --   --   --   --   --   < > = values in this interval not displayed.   Assessment: 74yo female remains therapeutic on heparin though dropping quickly and not yet fully rewarmed.  Goal of Therapy:  Heparin level 0.2-0.5 units/ml   Plan:  Will increase heparin gtt to 700 units/hr to prevent dropping  below goal and check level with am labs.  Vernard Gambles, PharmD, BCPS  08/22/2015,10:52 PM

## 2015-08-22 NOTE — Progress Notes (Addendum)
SUBJECTIVE: Sedated, cooling, on vent,  IABP with 1:1 augmentation  . antiseptic oral rinse  7 mL Mouth Rinse 10 times per day  . artificial tears  1 application Both Eyes 3 times per day  . aspirin  325 mg Oral Daily  . chlorhexidine gluconate  15 mL Mouth Rinse BID  . fentaNYL (SUBLIMAZE) injection  50 mcg Intravenous Once  . insulin aspart  2-6 Units Subcutaneous 6 times per day  . insulin glargine  10 Units Subcutaneous Q24H  . pantoprazole (PROTONIX) IV  40 mg Intravenous QHS   . sodium chloride 10 mL/hr at September 18, 2015 2100  . cisatracurium (NIMBEX) infusion 1.5 mcg/kg/min (September 18, 2015 2248)  . dextrose 5 % and 0.45% NaCl 10 mL/hr at 09/18/15 1900  . fentaNYL infusion INTRAVENOUS 200 mcg/hr (09-18-2015 2344)  . heparin 600 Units/hr (08/22/15 0529)  . insulin (NOVOLIN-R) infusion Stopped (08/22/15 0859)  . midazolam (VERSED) infusion 4 mg/hr (08/22/15 0552)  . nitroGLYCERIN    . norepinephrine (LEVOPHED) Adult infusion 39 mcg/min (08/22/15 1135)    OBJECTIVE: Physical Exam: Filed Vitals:   08/22/15 0815 08/22/15 0900 08/22/15 1000 08/22/15 1100  BP: 163/78 156/76 148/81 155/42  Pulse: 61 58 30 56  Temp:  91.4 F (33 C) 91.2 F (32.9 C) 91.2 F (32.9 C)  TempSrc:  Core (Comment) Core (Comment) Core (Comment)  Resp: Height:      Weight:      SpO2: 100% 100% 100% 100%    Intake/Output Summary (Last 24 hours) at 08/22/15 1148 Last data filed at 08/22/15 1100  Gross per 24 hour  Intake 2719.96 ml  Output   1385 ml  Net 1334.96 ml    Telemetry reveals sinus rhythm  GEN- The patient is ill appearing, sedated Head- normocephalic, atraumatic Eyes-  Sclera clear, conjunctiva pink Ears- hearing intact Oropharynx- ETT Neck- CVL Lungs- Clear to ausculation bilaterally  Heart- Regular rate and rhythm  GI- soft, NT, ND, + BS Extremities- very cool extremities Skin- no rash or lesion Psych- sedated Neuro- sedated  LABS: Basic Metabolic Panel:  Recent  Labs  08/22/15 0400 08/22/15 0449 08/22/15 0801  NA 137  --  134*  K 4.6  --  4.3  CL 109  --  106  CO2 18*  --  17*  GLUCOSE 156*  --  171*  BUN 38*  --  35*  CREATININE 1.33*  --  1.38*  CALCIUM 9.0  --  8.8*  MG  --  1.8  --   PHOS  --  3.0  --    Liver Function Tests:  Recent Labs  2015/09/18 1300 08/22/15 0449  AST 348* 247*  ALT 152* 128*  ALKPHOS 172* 164*  BILITOT 0.8 0.9  PROT 5.2* 5.9*  ALBUMIN 3.3* 3.1*   No results for input(s): LIPASE, AMYLASE in the last 72 hours. CBC:  Recent Labs  Sep 18, 2015 1300  09/18/2015 1743 08/22/15 0449  WBC 17.6*  --   --  15.5*  NEUTROABS 15.3*  --   --  12.1*  HGB 11.7*  < > 10.5* 12.1  HCT 34.9*  < > 31.0* 36.3  MCV 93.8  --   --  91.2  PLT 226  --   --  231  < > = values in this interval not displayed. Cardiac Enzymes:  Recent Labs  2015/09/18 1300 09/18/15 1539 2015/09/18 2148 08/22/15 0449  CKTOTAL 524*  --   --   --   CKMB 26.3*  --   --   --  TROPONINI 0.50* 1.70* 4.35* 5.67*   BNP: Invalid input(s): POCBNP D-Dimer: No results for input(s): DDIMER in the last 72 hours. Hemoglobin A1C:  Recent Labs  09/04/2015 1300  HGBA1C 6.7*   Fasting Lipid Panel:  Recent Labs  08/29/2015 1300  CHOL 167  HDL 70  LDLCALC 80  TRIG 86  CHOLHDL 2.4    ASSESSMENT AND PLAN:  Principal Problem:   Cardiac arrest Active Problems:   V-tach   CAD (coronary artery disease), native coronary artery   Acute left systolic heart failure  1. VF arrest Ischemic in nature Cooling, will rewarm later today CV surgery on board for consideration of revascularization if she recovers PCCM managing vent/ actic sun protocol  2. CAD/ Acute MI Pt with advanced CAD IABP in place with 1:1 augmentation Will keep balloon pump for another 24-48 hours On IV heparin If she recovers, will likely require CABG  Cardiology to follow  Hillis Range, MD 08/22/2015 11:48 AM

## 2015-08-22 NOTE — Progress Notes (Signed)
Maintain patient on insulin drip/ GlucoStabilizer per CCM. Will continue to monitor.

## 2015-08-22 NOTE — Progress Notes (Signed)
CRITICAL VALUE ALERT  Critical value received:  Troponin 4.35  Date of notification:  05-Sep-2015  Time of notification:  23:30  Critical value read back: yes  Nurse who received alert:  Leda Gauze  MD notified (1st page):  Jamison Neighbor  Time of first page: 08/22/15  01:00  Responding MD:  Jamison Neighbor  Time MD responded:  08/22/15 01:00

## 2015-08-22 NOTE — Progress Notes (Signed)
eLink Physician-Brief Progress Note Patient Name: Ann Carroll DOB: [birth year] MRN: [id number]   Date of Service  08/22/2015  HPI/Events of Note  Remains hypotensive  eICU Interventions  Give ns bolus     Intervention Category Major Interventions: Shock - evaluation and management  Shan Levans 08/22/2015, 4:03 PM

## 2015-08-22 NOTE — Progress Notes (Signed)
eLink Physician-Brief Progress Note Patient Name: Ann Carroll DOB: 12/05/1941 MRN: [id number]   Date of Service  08/22/2015  HPI/Events of Note  hypothyroidism  eICU Interventions  Iv levothyroxine ordered   iv     Intervention Category Major Interventions: Other:  Shan Levans 08/22/2015, 7:03 PM

## 2015-08-22 NOTE — Progress Notes (Signed)
Dr. Delford Field notified of continued low urine output around 10 cc/ hour.  Orders received.  Will continue to monitor.

## 2015-08-22 NOTE — Progress Notes (Signed)
Patient"s family discussed code status with cardiology MD and day shift RN. Patient's family wants her to be a Full Code. Patient's family would consider changing status if poor prognosis given (they do not want her to be dependent on ventilator).

## 2015-08-22 NOTE — Progress Notes (Signed)
CRITICAL VALUE ALERT  Critical value received:  Trop 5.67  Date of notification:  08/22/15  Time of notification:  0659  Critical value read back:Yes.    Nurse who received alert:  Hillery Aldo, RN  MD notified (1st page):  Result consistant with previously called and reported result.  Dr. Johney Frame notified on morning rounds.  Time of first page:  1100  MD notified (2nd page):  Time of second page:  Responding MD:  Dr. Johney Frame  Time MD responded:  1100
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
## 2015-08-22 NOTE — Progress Notes (Signed)
eLink Physician-Brief Progress Note Patient Name: Ann Carroll DOB: 05-09-1941 MRN: [id number]   Date of Service  08/22/2015  HPI/Events of Note  Pt needs MAP >/= 80 mmHg  On max levophed  eICU Interventions  Add vasopressin     Intervention Category Major Interventions: Shock - evaluation and management  Shan Levans 08/22/2015, 6:42 PM
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
## 2015-08-23 ENCOUNTER — Inpatient Hospital Stay (HOSPITAL_COMMUNITY): Payer: Medicare Other

## 2015-08-23 LAB — GLUCOSE, CAPILLARY
GLUCOSE-CAPILLARY: 112 mg/dL — AB (ref 65–99)
GLUCOSE-CAPILLARY: 112 mg/dL — AB (ref 65–99)
GLUCOSE-CAPILLARY: 47 mg/dL — AB (ref 65–99)
GLUCOSE-CAPILLARY: 84 mg/dL (ref 65–99)
GLUCOSE-CAPILLARY: 96 mg/dL (ref 65–99)
Glucose-Capillary: 181 mg/dL — ABNORMAL HIGH (ref 65–99)
Glucose-Capillary: 52 mg/dL — ABNORMAL LOW (ref 65–99)
Glucose-Capillary: 87 mg/dL (ref 65–99)

## 2015-08-23 LAB — BASIC METABOLIC PANEL
ANION GAP: 10 (ref 5–15)
Anion gap: 10 (ref 5–15)
Anion gap: 7 (ref 5–15)
BUN: 35 mg/dL — AB (ref 6–20)
BUN: 38 mg/dL — AB (ref 6–20)
BUN: 36 mg/dL — AB (ref 6–20)
CALCIUM: 8.1 mg/dL — AB (ref 8.9–10.3)
CALCIUM: 8.7 mg/dL — AB (ref 8.9–10.3)
CALCIUM: 8.6 mg/dL — AB (ref 8.9–10.3)
CHLORIDE: 109 mmol/L (ref 101–111)
CHLORIDE: 110 mmol/L (ref 101–111)
CO2: 16 mmol/L — AB (ref 22–32)
CO2: 18 mmol/L — AB (ref 22–32)
CO2: 25 mmol/L (ref 22–32)
CREATININE: 1.41 mg/dL — AB (ref 0.44–1.00)
CREATININE: 1.53 mg/dL — AB (ref 0.44–1.00)
Chloride: 104 mmol/L (ref 101–111)
Creatinine, Ser: 1.48 mg/dL — ABNORMAL HIGH (ref 0.44–1.00)
GFR calc Af Amer: 38 mL/min — ABNORMAL LOW (ref >60)
GFR calc Af Amer: 39 mL/min — ABNORMAL LOW (ref >60)
GFR calc non Af Amer: 36 mL/min — ABNORMAL LOW (ref >60)
GFR calc non Af Amer: 33 mL/min — ABNORMAL LOW (ref >60)
GFR, EST AFRICAN AMERICAN: 42 mL/min — AB (ref >60)
GFR, EST NON AFRICAN AMERICAN: 34 mL/min — AB (ref >60)
GLUCOSE: 184 mg/dL — AB (ref 65–99)
Glucose, Bld: 126 mg/dL — ABNORMAL HIGH (ref 65–99)
Glucose, Bld: 123 mg/dL — ABNORMAL HIGH (ref 65–99)
POTASSIUM: 5.3 mmol/L — AB (ref 3.5–5.1)
Potassium: 4.8 mmol/L (ref 3.5–5.1)
Potassium: 5.5 mmol/L — ABNORMAL HIGH (ref 3.5–5.1)
SODIUM: 139 mmol/L (ref 135–145)
Sodium: 135 mmol/L (ref 135–145)
Sodium: 135 mmol/L (ref 135–145)

## 2015-08-23 LAB — POCT I-STAT 3, ART BLOOD GAS (G3+)
ACID-BASE DEFICIT: 5 mmol/L — AB (ref 0.0–2.0)
Acid-base deficit: 12 mmol/L — ABNORMAL HIGH (ref 0.0–2.0)
Acid-base deficit: 6 mmol/L — ABNORMAL HIGH (ref 0.0–2.0)
BICARBONATE: 15.5 meq/L — AB (ref 20.0–24.0)
BICARBONATE: 19.5 meq/L — AB (ref 20.0–24.0)
Bicarbonate: 21.3 mEq/L (ref 20.0–24.0)
O2 Saturation: 97 %
O2 Saturation: 99 %
O2 Saturation: 98 %
PCO2 ART: 45.3 mmHg — AB (ref 35.0–45.0)
PO2 ART: 114 mmHg — AB (ref 80.0–100.0)
PO2 ART: 130 mmHg — AB (ref 80.0–100.0)
PO2 ART: 127 mmHg — AB (ref 80.0–100.0)
Patient temperature: 37
Patient temperature: 37.2
TCO2: 17 mmol/L (ref 0–100)
TCO2: 21 mmol/L (ref 0–100)
TCO2: 23 mmol/L (ref 0–100)
pCO2 arterial: 41.1 mmHg (ref 35.0–45.0)
pCO2 arterial: 39.8 mmHg (ref 35.0–45.0)
pH, Arterial: 7.185 — CL (ref 7.350–7.450)
pH, Arterial: 7.299 — ABNORMAL LOW (ref 7.350–7.450)
pH, Arterial: 7.279 — ABNORMAL LOW (ref 7.350–7.450)

## 2015-08-23 LAB — CBC
HEMATOCRIT: 34.6 % — AB (ref 36.0–46.0)
HEMOGLOBIN: 11.8 g/dL — AB (ref 12.0–15.0)
MCH: 31.6 pg (ref 26.0–34.0)
MCHC: 34.1 g/dL (ref 30.0–36.0)
MCV: 92.8 fL (ref 78.0–100.0)
Platelets: 143 10*3/uL — ABNORMAL LOW (ref 150–400)
RBC: 3.73 MIL/uL — ABNORMAL LOW (ref 3.87–5.11)
RDW: 14.2 % (ref 11.5–15.5)
WBC: 18.4 10*3/uL — AB (ref 4.0–10.5)

## 2015-08-23 LAB — HEPARIN LEVEL (UNFRACTIONATED)
HEPARIN UNFRACTIONATED: 0.34 [IU]/mL (ref 0.30–0.70)
HEPARIN UNFRACTIONATED: 0.24 [IU]/mL — AB (ref 0.30–0.70)

## 2015-08-23 LAB — POCT ACTIVATED CLOTTING TIME: ACTIVATED CLOTTING TIME: 91 s

## 2015-08-23 LAB — MAGNESIUM: MAGNESIUM: 2.3 mg/dL (ref 1.7–2.4)

## 2015-08-23 MED ORDER — DEXTROSE 50 % IV SOLN
INTRAVENOUS | Status: AC
  Administered 2015-08-23: 25 mL
  Filled 2015-08-23: qty 50

## 2015-08-23 MED ORDER — VITAL HIGH PROTEIN PO LIQD
1000.0000 mL | ORAL | Status: DC
  Filled 2015-08-23 (×2): qty 1000

## 2015-08-23 MED ORDER — SODIUM POLYSTYRENE SULFONATE 15 GM/60ML PO SUSP
30.0000 g | Freq: Once | ORAL | Status: AC
  Administered 2015-08-23: 30 g
  Filled 2015-08-23: qty 120

## 2015-08-23 MED ORDER — VITAL AF 1.2 CAL PO LIQD
1000.0000 mL | ORAL | Status: DC
  Administered 2015-08-23: 1000 mL
  Filled 2015-08-23 (×4): qty 1000

## 2015-08-23 MED ORDER — SODIUM CHLORIDE 0.9 % IV SOLN
3.0000 g | Freq: Four times a day (QID) | INTRAVENOUS | Status: DC
  Administered 2015-08-23 – 2015-08-25 (×8): 3 g via INTRAVENOUS
  Filled 2015-08-23 (×9): qty 3

## 2015-08-23 MED ORDER — SODIUM CHLORIDE 0.9 % IV SOLN
25.0000 ug/h | INTRAVENOUS | Status: DC
  Administered 2015-08-23: 150 ug/h via INTRAVENOUS
  Filled 2015-08-23: qty 50

## 2015-08-23 MED ORDER — LEVOTHYROXINE SODIUM 150 MCG PO TABS
150.0000 ug | ORAL_TABLET | Freq: Every day | ORAL | Status: DC
  Administered 2015-08-23 – 2015-08-27 (×4): 150 ug
  Filled 2015-08-23 (×4): qty 1

## 2015-08-23 MED ORDER — ASPIRIN 81 MG PO CHEW
81.0000 mg | CHEWABLE_TABLET | Freq: Every day | ORAL | Status: DC
  Administered 2015-08-23 – 2015-08-27 (×5): 81 mg
  Filled 2015-08-23 (×5): qty 1

## 2015-08-23 MED ORDER — SODIUM CHLORIDE 0.9 % IV SOLN
0.0000 mg/h | INTRAVENOUS | Status: DC
  Filled 2015-08-23: qty 10

## 2015-08-23 MED ORDER — PANTOPRAZOLE SODIUM 40 MG PO PACK
40.0000 mg | PACK | ORAL | Status: DC
  Administered 2015-08-23 – 2015-08-25 (×3): 40 mg
  Filled 2015-08-23 (×6): qty 20

## 2015-08-23 MED ORDER — STERILE WATER FOR INJECTION IV SOLN
INTRAVENOUS | Status: DC
Start: 2015-08-23 — End: 2015-08-24
  Administered 2015-08-23 (×2): via INTRAVENOUS
  Filled 2015-08-23 (×4): qty 850

## 2015-08-23 MED ORDER — HEPARIN (PORCINE) IN NACL 100-0.45 UNIT/ML-% IJ SOLN
1200.0000 [IU]/h | INTRAMUSCULAR | Status: DC
  Administered 2015-08-23: 750 [IU]/h via INTRAVENOUS
  Administered 2015-08-26: 900 [IU]/h via INTRAVENOUS
  Administered 2015-08-27: 1050 [IU]/h via INTRAVENOUS
  Filled 2015-08-23 (×3): qty 250

## 2015-08-23 MED ORDER — FENTANYL BOLUS VIA INFUSION
25.0000 ug | INTRAVENOUS | Status: DC | PRN
  Filled 2015-08-23: qty 25

## 2015-08-23 MED ORDER — FUROSEMIDE 10 MG/ML IJ SOLN
40.0000 mg | Freq: Once | INTRAMUSCULAR | Status: AC
  Administered 2015-08-23: 40 mg via INTRAVENOUS
  Filled 2015-08-23: qty 4

## 2015-08-23 MED ORDER — SODIUM BICARBONATE 8.4 % IV SOLN
100.0000 meq | Freq: Once | INTRAVENOUS | Status: AC
  Administered 2015-08-23: 100 meq via INTRAVENOUS
  Filled 2015-08-23: qty 50

## 2015-08-23 NOTE — Progress Notes (Signed)
IABP removed from rfa. Manual pressure applied for 25 minutes. No S+S of hematoma , or ecchymosis. Groin level 0. Tegaderm dressing applied. Patient intubated, unresponsive.  Distal pulses barely palpable dp bilateral. Bilateral dp pulses confirmed present with doppler.   Bedrest begins at 16:00:00

## 2015-08-23 NOTE — Progress Notes (Signed)
ANTICOAGULATION CONSULT NOTE - Follow Up Consult  Pharmacy Consult for hepqrin Indication: IABP on hypothermia  Labs:  Recent Labs  09-03-15 1300 September 03, 2015 1539  03-Sep-2015 1632 09-03-2015 1743  09-03-15 2148  08/22/15 0449  08/22/15 1152 08/22/15 1600 08/22/15 1950 08/22/15 2210 08/23/15 0015 08/23/15 0350  HGB 11.7*  --   < >  --  10.5*  --   --   --  12.1  --   --   --   --   --   --  11.8*  HCT 34.9*  --   < >  --  31.0*  --   --   --  36.3  --   --   --   --   --   --  34.6*  PLT 226  --   --   --   --   --   --   --  231  --   --   --   --   --   --  143*  APTT 25  --   --  57*  --   --  53*  --   --   --   --   --   --   --   --   --   LABPROT 15.0  --   --  15.2  --   --  14.9  --   --   --   --   --   --   --   --   --   INR 1.16  --   --  1.18  --   --  1.15  --   --   --   --   --   --   --   --   --   HEPARINUNFRC  --   --   --   --   --   < >  --   --   --   < > 0.50  --   --  0.29*  --  0.34  CREATININE 1.27*  --   < > 1.31* 0.90  --   --   < >  --   < >  --  1.35* 1.35*  --  1.41*  --   CKTOTAL 524*  --   --   --   --   --   --   --   --   --   --   --   --   --   --   --   CKMB 26.3*  --   --   --   --   --   --   --   --   --   --   --   --   --   --   --   TROPONINI 0.50* 1.70*  --   --   --   --  4.35*  --  5.67*  --   --   --   --   --   --   --   < > = values in this interval not displayed.   Assessment: 74yo female with IABP, hypothermia, now rewarmed heparin level = 0.34, therapeutic on 700 units/hr. Hgb 11.8, stable, plt 143 dropping.  Goal of Therapy:  Heparin level 0.2-0.5 units/ml   Plan:  Continue heparin gtt 700 units/hr Recheck heparin level at 1200 to confirm  Bayard Hugger, PharmD, BCPS  Clinical Pharmacist  Pager: [phone]  08/23/2015,4:24 AM

## 2015-08-23 NOTE — Progress Notes (Signed)
SUBJECTIVE: Sedated, warming.  Moves but does not follow commands.  IABP appears to have clotted.  New RLL infiltrate.  Marland Kitchen ampicillin-sulbactam (UNASYN) IV  3 g Intravenous Q6H  . antiseptic oral rinse  7 mL Mouth Rinse 10 times per day  . artificial tears  1 application Both Eyes 3 times per day  . aspirin  325 mg Oral Daily  . chlorhexidine gluconate  15 mL Mouth Rinse BID  . insulin aspart  2-6 Units Subcutaneous 6 times per day  . insulin glargine  10 Units Subcutaneous Q24H  . levothyroxine  150 mcg Per Tube QAC breakfast  . pantoprazole sodium  40 mg Per Tube Q24H   . sodium chloride 10 mL/hr at 08/23/15 0840  . feeding supplement (VITAL AF 1.2 CAL)    . fentaNYL infusion INTRAVENOUS 150 mcg/hr (08/23/15 0800)  . heparin 700 Units/hr (08/23/15 0801)  . insulin (NOVOLIN-R) infusion Stopped (08/22/15 0859)  . midazolam (VERSED) infusion 4 mg/hr (08/23/15 0900)  . nitroGLYCERIN    . norepinephrine (LEVOPHED) Adult infusion 40 mcg/min (08/23/15 0933)  .  sodium bicarbonate 150 mEq in sterile water 1000 mL infusion 75 mL/hr at 08/23/15 0538  . vasopressin (PITRESSIN) infusion - *FOR SHOCK* 0.03 Units/min (08/23/15 0800)    OBJECTIVE: Physical Exam: Filed Vitals:   08/23/15 0600 08/23/15 0700 08/23/15 0800 08/23/15 0900  BP: 107/77 136/57 147/53 129/54  Pulse: 72 82  102  Temp: 98.8 F (37.1 C) 99.5 F (37.5 C) 99.3 F (37.4 C) 98.6 F (37 C)  TempSrc: Core (Comment) Core (Comment) Core (Comment) Core (Comment)  Resp: 30 30  16   Height:      Weight:      SpO2: 100% 100% 100% 98%    Intake/Output Summary (Last 24 hours) at 08/23/15 0957 Last data filed at 08/23/15 0900  Gross per 24 hour  Intake 2990.97 ml  Output    748 ml  Net 2242.97 ml    Telemetry reveals sinus rhythm  GEN- The patient is ill appearing, sedated Head- normocephalic, atraumatic Eyes-  Sclera clear, conjunctiva pink Ears- hearing intact Oropharynx- ETT Neck- CVL Lungs- Clear to  ausculation bilaterally  Heart- Regular rate and rhythm  GI- soft, NT, ND, + BS Extremities- very cool extremities Skin- no rash or lesion Psych- sedated Neuro- sedated  LABS: Basic Metabolic Panel:  Recent Labs  96/04/54 0449  08/23/15 0015 08/23/15 0350  NA  --   < > 135 135  K  --   < > 4.8 5.5*  CL  --   < > 109 110  CO2  --   < > 16* 18*  GLUCOSE  --   < > 126* 184*  BUN  --   < > 38* 36*  CREATININE  --   < > 1.41* 1.53*  CALCIUM  --   < > 8.7* 8.6*  MG 1.8  --   --  2.3  PHOS 3.0  --   --   --   < > = values in this interval not displayed. Liver Function Tests:  Recent Labs  08/16/2015 1300 08/22/15 0449  AST 348* 247*  ALT 152* 128*  ALKPHOS 172* 164*  BILITOT 0.8 0.9  PROT 5.2* 5.9*  ALBUMIN 3.3* 3.1*   No results for input(s): LIPASE, AMYLASE in the last 72 hours. CBC:  Recent Labs  08/20/2015 1300  08/22/15 0449 08/23/15 0350  WBC 17.6*  --  15.5* 18.4*  NEUTROABS 15.3*  --  12.1*  --  HGB 11.7*  < > 12.1 11.8*  HCT 34.9*  < > 36.3 34.6*  MCV 93.8  --  91.2 92.8  PLT 226  --  231 143*  < > = values in this interval not displayed. Cardiac Enzymes:  Recent Labs  2015/09/07 1300 2015/09/07 1539 09-07-2015 2148 08/22/15 0449  CKTOTAL 524*  --   --   --   CKMB 26.3*  --   --   --   TROPONINI 0.50* 1.70* 4.35* 5.67*   BNP: Invalid input(s): POCBNP D-Dimer: No results for input(s): DDIMER in the last 72 hours. Hemoglobin A1C:  Recent Labs  09-07-15 1300  HGBA1C 6.7*   Fasting Lipid Panel:  Recent Labs  September 07, 2015 1300  CHOL 167  HDL 70  LDLCALC 80  TRIG 86  CHOLHDL 2.4    ASSESSMENT AND PLAN:  Principal Problem:   Cardiac arrest Active Problems:   V-tach   CAD (coronary artery disease), native coronary artery   Acute left systolic heart failure  1. VF arrest Ischemic in nature Now rewarming but with trouble reaching target temp CV surgery on board for consideration of revascularization if she recovers PCCM managing vent/  rewarming protocol  2. CAD/ Acute MI Pt with advanced CAD (cath reveals 90%LM, long 80% RCA) IABP has clotted.  MD unable to aspirate or flush. Seems to be doing ok when changed to 1:2 augmentation, will therefore remove IABP today and follow closely On IV heparin If she recovers, will likely require CABG  3. Reduce platelet count Likely due to IABP Will reassess once IABP has been removed  This patient is critically ill and at significant risk of cardiovascular worsening, death and care requires constant monitoring of vital signs, hemodynamics,respiratory and cardiac monitoring, neurological assessment,  and medical decision making of high complexity. I spent 30 minutes of critical care time in the care of this patient today.  Her prognosis is guarded at best.   Fayrene Fearing Wirt Hemmerich,MD 10:01 AM 08/23/2015     Cardiology to follow  Hillis Range, MD 08/23/2015 9:57 AM

## 2015-08-23 NOTE — Progress Notes (Signed)
Nimbex has now been turned off at 36.0, with patient opening eyes really seconds  prior to D/C ing Nimbex. With Nimbex off, eyes are opening more and heart  rate has increased to 70- 80's with increase in B/P, shivering noted, a warm blanket for shivering, left arm and legs noted with movement. Continue to monitor closely orient patient to surroundings and situation.

## 2015-08-23 NOTE — Progress Notes (Signed)
Orthopedic Tech Progress Note Patient Details:  Ann Carroll 1941/04/23 [id number] Delivered to room for application by nursing Ortho Devices Type of Ortho Device: Knee Immobilizer Ortho Device/Splint Interventions: Ordered   [name] 08/23/2015, 2:43 AM

## 2015-08-23 NOTE — Progress Notes (Addendum)
ANTICOAGULATION CONSULT NOTE - Follow Up Consult  Pharmacy Consult:  Heparin Indication:  CAD s/p cath with 3vdz, awaiting CABG  No Known Allergies  Patient Measurements: Height:  (175.3 cm) Weight: 148 lb 5.9 oz (67.3 kg) (Wt. reflects subtraction of pads with water) IBW/kg (Calculated) : 66.2 Heparin Dosing Weight:  67 kg  Vital Signs: Temp: 97.7 F (36.5 C) (09/11 1700) Temp Source: Core (Comment) (09/11 1700) BP: 108/59 mmHg (09/11 1700) Pulse Rate: 84 (09/11 1600)  Labs:  Recent Labs  August 25, 2015 1300 Aug 25, 2015 1539  25-Aug-2015 1632 08/25/2015 1743  08/25/15 2148  08/22/15 0449  08/22/15 1950 08/22/15 2210 08/23/15 0015 08/23/15 0350 08/23/15 1155  HGB 11.7*  --   < >  --  10.5*  --   --   --  12.1  --   --   --   --  11.8*  --   HCT 34.9*  --   < >  --  31.0*  --   --   --  36.3  --   --   --   --  34.6*  --   PLT 226  --   --   --   --   --   --   --  231  --   --   --   --  143*  --   APTT 25  --   --  57*  --   --  53*  --   --   --   --   --   --   --   --   LABPROT 15.0  --   --  15.2  --   --  14.9  --   --   --   --   --   --   --   --   INR 1.16  --   --  1.18  --   --  1.15  --   --   --   --   --   --   --   --   HEPARINUNFRC  --   --   --   --   --   < >  --   --   --   < >  --  0.29*  --  0.34 0.24*  CREATININE 1.27*  --   < > 1.31* 0.90  --   --   < >  --   < > 1.35*  --  1.41* 1.53*  --   CKTOTAL 524*  --   --   --   --   --   --   --   --   --   --   --   --   --   --   CKMB 26.3*  --   --   --   --   --   --   --   --   --   --   --   --   --   --   TROPONINI 0.50* 1.70*  --   --   --   --  4.35*  --  5.67*  --   --   --   --   --   --   < > = values in this interval not displayed.  Estimated Creatinine Clearance: 34.2 mL/min (by C-G formula based on Cr of 1.53).     Assessment: [name] s/p cath and found to have 3vdz,  now awaiting possible CABG.  Patient's IABP was removed today around 1600 and heparin to resume in 6 hours per Dr. Johney Frame.  No  bleeding issue reported.  Goal of Therapy:  HL 0.3-0.5 units/mL Monitor platelets by anticoagulation protocol: Yes    Plan:  - At 2200, resume heparin gtt at 750 units/hr - Check 8 hr HL - Daily HL / CBC - Monitor closely for bleeding    Rumeal Cullipher D. Laney Potash, PharmD, BCPS Pager:  [phone] 08/23/2015, 5:11 PM
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
## 2015-08-23 NOTE — Progress Notes (Signed)
eLink Physician-Brief Progress Note Patient Name: Ann Carroll DOB: [birth year] MRN: [id number]   Date of Service  08/23/2015  HPI/Events of Note  ABG on 40%/PRVC 22/TV 520/P 5 = 7.1/41/114/15.5. CVP = 15, K+ 5.5 and Creatinine = 1.53.   eICU Interventions  Will order:  1. Increase rate to 30. 2. NaHCO3 100 meq IV now. 3. NaHCO3 IV infusion at 75 mL/hour.  4. Repeat ABG at 6:15 AM.     Intervention Category Major Interventions: Acid-Base disturbance - evaluation and management;Respiratory failure - evaluation and management  Sommer,Steven Dennard Nip 08/23/2015, 5:19 AM
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
## 2015-08-23 NOTE — Progress Notes (Signed)
Spoke with E-link MD Dr.Sommer concerning with set order regarding temperature management to turn the versed and fentanyl off but with IABP will continue at lowest rate to keep patient comfortable.

## 2015-08-23 NOTE — Progress Notes (Signed)
Augmentation pressure decreased. Pressure bag changed re- zeroed unable to aspirate from IABP line. Dr. Johney Frame notified he attempted to flush, unable. Orders to discontinue IABP. Patient placed on 1:2 ration heparin on hold will notify cath lab when to remove.

## 2015-08-23 NOTE — Progress Notes (Signed)
CRITICAL VALUE ALERT  Critical value received:  glucose  Date of notification: 08/23/15  Time of notification: 1615  Critical value read back:yes  Nurse who received alert:yes glucose treated per protocal  MD notified (1st page):  no  Time of first page:    MD notified (2nd page):  Time of second page:  Responding MD:   Time MD responded:

## 2015-08-23 NOTE — Progress Notes (Signed)
Brief Nutrition Note  Consult received for enteral/tube feeding initiation and management.  Adult Enteral Nutrition Protocol already initiated. RD adjusted order to reflect recommendations provided in initial assessment on 9/10.  Admitting Dx: level 1 Post Arrest  Body mass index is 21.9 kg/(m^2). Pt meets criteria for normal range based on current BMI.  Labs:   Recent Labs Lab 08/22/15 0449  08/22/15 1950 08/23/15 0015 08/23/15 0350  NA  --   < > 135 135 135  K  --   < > 4.2 4.8 5.5*  CL  --   < > 108 109 110  CO2  --   < > 17* 16* 18*  BUN  --   < > 36* 38* 36*  CREATININE  --   < > 1.35* 1.41* 1.53*  CALCIUM  --   < > 8.7* 8.7* 8.6*  MG 1.8  --   --   --  2.3  PHOS 3.0  --   --   --   --   GLUCOSE  --   < > 94 126* 184*  < > = values in this interval not displayed.  Tilda Franco, MS, RD, LDN Pager: [phone] After Hours Pager: [phone]

## 2015-08-24 ENCOUNTER — Inpatient Hospital Stay (HOSPITAL_COMMUNITY): Payer: Medicare Other

## 2015-08-24 DIAGNOSIS — I5021 Acute systolic (congestive) heart failure: Secondary | ICD-10-CM

## 2015-08-24 DIAGNOSIS — I509 Heart failure, unspecified: Secondary | ICD-10-CM

## 2015-08-24 DIAGNOSIS — I2510 Atherosclerotic heart disease of native coronary artery without angina pectoris: Secondary | ICD-10-CM

## 2015-08-24 LAB — POCT I-STAT 3, ART BLOOD GAS (G3+)
ACID-BASE DEFICIT: 1 mmol/L (ref 0.0–2.0)
BICARBONATE: 23.2 meq/L (ref 20.0–24.0)
O2 Saturation: 99 %
PH ART: 7.409 (ref 7.350–7.450)
PO2 ART: 150 mmHg — AB (ref 80.0–100.0)
TCO2: 24 mmol/L (ref 0–100)
pCO2 arterial: 36.6 mmHg (ref 35.0–45.0)

## 2015-08-24 LAB — HEPARIN LEVEL (UNFRACTIONATED)
HEPARIN UNFRACTIONATED: 0.15 [IU]/mL — AB (ref 0.30–0.70)
HEPARIN UNFRACTIONATED: 0.4 [IU]/mL (ref 0.30–0.70)
Heparin Unfractionated: 0.41 IU/mL (ref 0.30–0.70)

## 2015-08-24 LAB — GLUCOSE, CAPILLARY
GLUCOSE-CAPILLARY: 117 mg/dL — AB (ref 65–99)
GLUCOSE-CAPILLARY: 47 mg/dL — AB (ref 65–99)
GLUCOSE-CAPILLARY: 62 mg/dL — AB (ref 65–99)
GLUCOSE-CAPILLARY: 73 mg/dL (ref 65–99)
GLUCOSE-CAPILLARY: 84 mg/dL (ref 65–99)
GLUCOSE-CAPILLARY: 90 mg/dL (ref 65–99)
Glucose-Capillary: 112 mg/dL — ABNORMAL HIGH (ref 65–99)
Glucose-Capillary: 131 mg/dL — ABNORMAL HIGH (ref 65–99)
Glucose-Capillary: 48 mg/dL — ABNORMAL LOW (ref 65–99)
Glucose-Capillary: 58 mg/dL — ABNORMAL LOW (ref 65–99)
Glucose-Capillary: 70 mg/dL (ref 65–99)
Glucose-Capillary: 72 mg/dL (ref 65–99)
Glucose-Capillary: 98 mg/dL (ref 65–99)

## 2015-08-24 LAB — CBC
HEMATOCRIT: 31.2 % — AB (ref 36.0–46.0)
HEMOGLOBIN: 10.2 g/dL — AB (ref 12.0–15.0)
MCH: 30.5 pg (ref 26.0–34.0)
MCHC: 32.7 g/dL (ref 30.0–36.0)
MCV: 93.4 fL (ref 78.0–100.0)
Platelets: 107 10*3/uL — ABNORMAL LOW (ref 150–400)
RBC: 3.34 MIL/uL — AB (ref 3.87–5.11)
RDW: 14.4 % (ref 11.5–15.5)
WBC: 13.5 10*3/uL — AB (ref 4.0–10.5)

## 2015-08-24 LAB — BASIC METABOLIC PANEL
ANION GAP: 14 (ref 5–15)
BUN: 40 mg/dL — ABNORMAL HIGH (ref 6–20)
CHLORIDE: 100 mmol/L — AB (ref 101–111)
CO2: 24 mmol/L (ref 22–32)
CREATININE: 1.76 mg/dL — AB (ref 0.44–1.00)
Calcium: 7.7 mg/dL — ABNORMAL LOW (ref 8.9–10.3)
GFR calc non Af Amer: 28 mL/min — ABNORMAL LOW (ref >60)
GFR, EST AFRICAN AMERICAN: 32 mL/min — AB (ref >60)
Glucose, Bld: 84 mg/dL (ref 65–99)
Potassium: 4.8 mmol/L (ref 3.5–5.1)
SODIUM: 138 mmol/L (ref 135–145)

## 2015-08-24 LAB — MAGNESIUM: MAGNESIUM: 2.1 mg/dL (ref 1.7–2.4)

## 2015-08-24 MED ORDER — PERFLUTREN LIPID MICROSPHERE
INTRAVENOUS | Status: AC
  Filled 2015-08-24: qty 10

## 2015-08-24 MED ORDER — DEXTROSE 10 % IV SOLN
INTRAVENOUS | Status: DC
  Administered 2015-08-24 – 2015-08-26 (×3): via INTRAVENOUS

## 2015-08-24 MED ORDER — INSULIN ASPART 100 UNIT/ML ~~LOC~~ SOLN
0.0000 [IU] | SUBCUTANEOUS | Status: DC
  Administered 2015-08-24 – 2015-08-25 (×2): 2 [IU] via SUBCUTANEOUS
  Administered 2015-08-25: 3 [IU] via SUBCUTANEOUS
  Administered 2015-08-25 – 2015-08-26 (×6): 2 [IU] via SUBCUTANEOUS
  Administered 2015-08-26: 3 [IU] via SUBCUTANEOUS
  Administered 2015-08-27: 2 [IU] via SUBCUTANEOUS

## 2015-08-24 MED ORDER — DEXTROSE 50 % IV SOLN
INTRAVENOUS | Status: AC
  Administered 2015-08-24: 25 mL
  Filled 2015-08-24: qty 50

## 2015-08-24 MED ORDER — MIDAZOLAM HCL 2 MG/2ML IJ SOLN: 1.0000 mg | INTRAMUSCULAR | Status: DC | PRN

## 2015-08-24 MED ORDER — DEXTROSE-NACL 5-0.9 % IV SOLN
INTRAVENOUS | Status: DC
  Administered 2015-08-24: 02:00:00 via INTRAVENOUS

## 2015-08-24 MED ORDER — FENTANYL CITRATE (PF) 100 MCG/2ML IJ SOLN: 50.0000 ug | Freq: Once | INTRAMUSCULAR | Status: DC

## 2015-08-24 MED ORDER — FUROSEMIDE 10 MG/ML IJ SOLN
40.0000 mg | Freq: Two times a day (BID) | INTRAMUSCULAR | Status: DC
  Administered 2015-08-24 – 2015-08-25 (×3): 40 mg via INTRAVENOUS
  Filled 2015-08-24 (×3): qty 4

## 2015-08-24 MED ORDER — SODIUM CHLORIDE 0.9 % IV SOLN
25.0000 ug/h | INTRAVENOUS | Status: DC
  Filled 2015-08-24: qty 50

## 2015-08-24 MED ORDER — PERFLUTREN LIPID MICROSPHERE
1.0000 mL | INTRAVENOUS | Status: AC | PRN
  Filled 2015-08-24: qty 10

## 2015-08-24 MED ORDER — FENTANYL CITRATE (PF) 100 MCG/2ML IJ SOLN
50.0000 ug | INTRAMUSCULAR | Status: DC | PRN
  Administered 2015-08-24: 50 ug via INTRAVENOUS
  Filled 2015-08-24: qty 2

## 2015-08-24 MED ORDER — FENTANYL BOLUS VIA INFUSION
25.0000 ug | INTRAVENOUS | Status: DC | PRN
  Filled 2015-08-24: qty 25

## 2015-08-24 MED FILL — Nitroglycerin IV Soln 100 MCG/ML in D5W: INTRA_ARTERIAL | Qty: 10 | Status: AC

## 2015-08-24 MED FILL — Heparin Sodium (Porcine) 2 Unit/ML in Sodium Chloride 0.9%: INTRAMUSCULAR | Qty: 1000 | Status: AC
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
## 2015-08-24 NOTE — Progress Notes (Signed)
eLink Physician-Brief Progress Note Patient Name: Ann Carroll DOB: 09/05/1941 MRN: [id number]   Date of Service  08/24/2015  HPI/Events of Note  Glu low again  eICU Interventions  Restarted Tf at 30 Add d10w     Intervention Category Intermediate Interventions: Electrolyte abnormality - evaluation and management  Nelda Bucks. 08/24/2015, 4:59 PM

## 2015-08-24 NOTE — Progress Notes (Addendum)
ANTICOAGULATION CONSULT NOTE - Follow Up Consult  Pharmacy Consult for heparin Indication: CAD awaiting CABG   Labs:  Recent Labs  09-18-15 1539  Sep 18, 2015 1632  2015-09-18 2148  08/22/15 0449  08/23/15 0350 08/23/15 1155 08/23/15 1705 08/24/15 0400 08/24/15 1237  HGB  --   < >  --   < >  --   --  12.1  --  11.8*  --   --  10.2*  --   HCT  --   < >  --   < >  --   --  36.3  --  34.6*  --   --  31.2*  --   PLT  --   --   --   --   --   --  231  --  143*  --   --  107*  --   APTT  --   --  57*  --  53*  --   --   --   --   --   --   --   --   LABPROT  --   --  15.2  --  14.9  --   --   --   --   --   --   --   --   INR  --   --  1.18  --  1.15  --   --   --   --   --   --   --   --   HEPARINUNFRC  --   --   --   < >  --   --   --   < > 0.34 0.24*  --  0.15* 0.40  CREATININE  --   < > 1.31*  < >  --   < >  --   < > 1.53*  --  1.48* 1.76*  --   TROPONINI 1.70*  --   --   --  4.35*  --  5.67*  --   --   --   --   --   --   < > = values in this interval not displayed.   Assessment: 74yo female therapeutic (0.40) on heparin which was resumed s/p IABP removal; pt has been fully rewarmed.  Goal of Therapy:  Heparin level 0.3-0.7 units/ml   Plan:  Continue heparin at 900 units/hr and check level in 8hr. Monitor signs and symptoms of bleeding Daily CBC  Ann Carroll, PharmD Clinical Pharmacy Resident Pager: [phone] 08/24/2015 1:36 PM   ____________________________________________ Addendum:  HL 0.41. Continue 900 units/hr and re-check with [facility] on 9/12  Ann Carroll, PharmD, BCPS Clinical Pharmacist Pager [phone] 08/24/2015 10:07 PM
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
## 2015-08-24 NOTE — Progress Notes (Signed)
Echocardiogram 2D Echocardiogram with Definity has been performed.  Ann Carroll 08/24/2015, 12:50 PM
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
## 2015-08-24 NOTE — Progress Notes (Signed)
eLink Physician-Brief Progress Note Patient Name: Ann Carroll DOB: October 18, 1941 MRN: [id number]   Date of Service  08/24/2015  HPI/Events of Note  Pain reported even with prn fent Start drip  eICU Interventions       Intervention Category Intermediate Interventions: Pain - evaluation and management  Nelda Bucks. 08/24/2015, 3:47 PM

## 2015-08-24 NOTE — Progress Notes (Signed)
ANTICOAGULATION CONSULT NOTE - Follow Up Consult  Pharmacy Consult for heparin Indication: CAD awaiting CABG   Labs:  Recent Labs  08/22/2015 1300 08/30/2015 1539  08/17/2015 1632  08/14/2015 2148  08/22/15 0449  08/23/15 0015 08/23/15 0350 08/23/15 1155 08/23/15 1705 08/24/15 0400  HGB 11.7*  --   < >  --   < >  --   --  12.1  --   --  11.8*  --   --  10.2*  HCT 34.9*  --   < >  --   < >  --   --  36.3  --   --  34.6*  --   --  31.2*  PLT 226  --   --   --   --   --   --  231  --   --  143*  --   --  PENDING  APTT 25  --   --  57*  --  53*  --   --   --   --   --   --   --   --   LABPROT 15.0  --   --  15.2  --  14.9  --   --   --   --   --   --   --   --   INR 1.16  --   --  1.18  --  1.15  --   --   --   --   --   --   --   --   HEPARINUNFRC  --   --   --   --   < >  --   --   --   < >  --  0.34 0.24*  --  0.15*  CREATININE 1.27*  --   < > 1.31*  < >  --   < >  --   < > 1.41* 1.53*  --  1.48*  --   CKTOTAL 524*  --   --   --   --   --   --   --   --   --   --   --   --   --   CKMB 26.3*  --   --   --   --   --   --   --   --   --   --   --   --   --   TROPONINI 0.50* 1.70*  --   --   --  4.35*  --  5.67*  --   --   --   --   --   --   < > = values in this interval not displayed.   Assessment: 74yo female subtherapeutic on heparin after resumed s/p IABP removal; pt has been fully rewarmed.  Goal of Therapy:  Heparin level 0.3-0.7 units/ml   Plan:  Will increase heparin gtt by 2-3 units/kg/hr to 900 units/hr and check level in 8hr.  Vernard Gambles, PharmD, BCPS  08/24/2015,4:57 AM

## 2015-08-24 NOTE — Progress Notes (Addendum)
eLink Physician-Brief Progress Note Patient Name: Ann Carroll DOB: Oct 17, 1941 MRN: [id number]   Date of Service  08/24/2015  HPI/Events of Note  Multiple issues: 1. Oliguria - urine output since 7 PM = 50 mL. CVP = 17. BP = 98/74. IABP now out. 2. Patient has had blood glucose decrease into the high 40's and low 50's this evening.  eICU Interventions  Will order: 1. Lasix 40 mg IV now.  2. D5 0.9 NaCl IV infusion at 40 mL/hour.      Intervention Category Intermediate Interventions: Oliguria - evaluation and management  Sommer,Steven Eugene 08/24/2015, 1:15 AM

## 2015-08-25 ENCOUNTER — Inpatient Hospital Stay (HOSPITAL_COMMUNITY): Payer: Medicare Other

## 2015-08-25 LAB — CBC
HCT: 28.5 % — ABNORMAL LOW (ref 36.0–46.0)
Hemoglobin: 9.5 g/dL — ABNORMAL LOW (ref 12.0–15.0)
MCH: 30.5 pg (ref 26.0–34.0)
MCHC: 33.3 g/dL (ref 30.0–36.0)
MCV: 91.6 fL (ref 78.0–100.0)
Platelets: 97 10*3/uL — ABNORMAL LOW (ref 150–400)
RBC: 3.11 MIL/uL — ABNORMAL LOW (ref 3.87–5.11)
RDW: 14.2 % (ref 11.5–15.5)
WBC: 13.5 10*3/uL — ABNORMAL HIGH (ref 4.0–10.5)

## 2015-08-25 LAB — HEPARIN LEVEL (UNFRACTIONATED): Heparin Unfractionated: 0.3 IU/mL (ref 0.30–0.70)

## 2015-08-25 LAB — BASIC METABOLIC PANEL
ANION GAP: 16 — AB (ref 5–15)
BUN: 51 mg/dL — ABNORMAL HIGH (ref 6–20)
CALCIUM: 7.2 mg/dL — AB (ref 8.9–10.3)
CHLORIDE: 97 mmol/L — AB (ref 101–111)
CO2: 23 mmol/L (ref 22–32)
Creatinine, Ser: 2.14 mg/dL — ABNORMAL HIGH (ref 0.44–1.00)
GFR calc non Af Amer: 22 mL/min — ABNORMAL LOW (ref >60)
GFR, EST AFRICAN AMERICAN: 25 mL/min — AB (ref >60)
Glucose, Bld: 182 mg/dL — ABNORMAL HIGH (ref 65–99)
POTASSIUM: 4 mmol/L (ref 3.5–5.1)
Sodium: 136 mmol/L (ref 135–145)

## 2015-08-25 LAB — BLOOD GAS, ARTERIAL
Acid-base deficit: 1.3 mmol/L (ref 0.0–2.0)
BICARBONATE: 21.5 meq/L (ref 20.0–24.0)
Drawn by: 437071
FIO2: 0.3
LHR: 20 {breaths}/min
O2 SAT: 97.9 %
PATIENT TEMPERATURE: 98.6
PCO2 ART: 27.6 mmHg — AB (ref 35.0–45.0)
PEEP/CPAP: 5 cmH2O
Pressure control: 25 cmH2O
TCO2: 22.3 mmol/L (ref 0–100)
pH, Arterial: 7.502 — ABNORMAL HIGH (ref 7.350–7.450)
pO2, Arterial: 106 mmHg — ABNORMAL HIGH (ref 80.0–100.0)

## 2015-08-25 LAB — GLUCOSE, CAPILLARY
GLUCOSE-CAPILLARY: 147 mg/dL — AB (ref 65–99)
GLUCOSE-CAPILLARY: 138 mg/dL — AB (ref 65–99)
GLUCOSE-CAPILLARY: 124 mg/dL — AB (ref 65–99)
GLUCOSE-CAPILLARY: 117 mg/dL — AB (ref 65–99)
Glucose-Capillary: 171 mg/dL — ABNORMAL HIGH (ref 65–99)
Glucose-Capillary: 114 mg/dL — ABNORMAL HIGH (ref 65–99)

## 2015-08-25 MED ORDER — SODIUM CHLORIDE 0.9 % IV SOLN
3.0000 g | Freq: Two times a day (BID) | INTRAVENOUS | Status: DC
  Administered 2015-08-25 – 2015-08-26 (×2): 3 g via INTRAVENOUS
  Filled 2015-08-25 (×3): qty 3

## 2015-08-25 MED ORDER — FUROSEMIDE 10 MG/ML IJ SOLN: 40.0000 mg | Freq: Every day | INTRAMUSCULAR | Status: DC

## 2015-08-25 MED ORDER — FENTANYL CITRATE (PF) 100 MCG/2ML IJ SOLN
25.0000 ug | INTRAMUSCULAR | Status: DC | PRN
  Administered 2015-08-25 – 2015-08-26 (×6): 50 ug via INTRAVENOUS
  Administered 2015-08-27: 25 ug via INTRAVENOUS
  Administered 2015-08-27 (×3): 50 ug via INTRAVENOUS
  Administered 2015-08-28: 25 ug via INTRAVENOUS
  Filled 2015-08-25 (×12): qty 2

## 2015-08-25 NOTE — Care Management Important Message (Signed)
Important Message  Patient Details  Name: Ann Carroll MRN: [id number] Date of Birth: 1941-09-25   Medicare Important Message Given:  Yes-second notification given    Orson Aloe 08/25/2015, 8:42 AM

## 2015-08-25 NOTE — Progress Notes (Signed)
Fentanyl IV 50CCs wasted in the sink with Lennart Gladish,R.N.
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
## 2015-08-25 NOTE — Procedures (Signed)
Extubation Procedure Note  Patient Details:   Name: Ann Carroll DOB: July 18, 1941 MRN: [id number]   Airway Documentation:  Airway 8 mm (Active)  Secured at (cm) 24 cm 08/25/2015  9:34 AM  Measured From Lips 08/25/2015  9:34 AM  Secured Location Left 08/25/2015  9:34 AM  Secured By [employer] 08/25/2015  9:34 AM  Tube Holder Repositioned Yes 08/25/2015  9:34 AM  Cuff Pressure (cm H2O) 25 cm H2O 08/25/2015  3:30 AM  Site Condition Dry 08/25/2015  9:34 AM   Pt extubated to 3lpm Fox, sats 98%.  Evaluation  O2 sats: stable throughout Complications: No apparent complications Patient did tolerate procedure well. Bilateral Breath Sounds: Clear, Diminished Suctioning: Airway Yes  Renae Fickle 08/25/2015, 11:41 AM
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
## 2015-08-26 ENCOUNTER — Inpatient Hospital Stay (HOSPITAL_COMMUNITY): Payer: Medicare Other

## 2015-08-26 DIAGNOSIS — N170 Acute kidney failure with tubular necrosis: Secondary | ICD-10-CM

## 2015-08-26 DIAGNOSIS — J690 Pneumonitis due to inhalation of food and vomit: Secondary | ICD-10-CM | POA: Insufficient documentation

## 2015-08-26 DIAGNOSIS — I5041 Acute combined systolic (congestive) and diastolic (congestive) heart failure: Secondary | ICD-10-CM

## 2015-08-26 LAB — GLUCOSE, CAPILLARY
GLUCOSE-CAPILLARY: 115 mg/dL — AB (ref 65–99)
GLUCOSE-CAPILLARY: 156 mg/dL — AB (ref 65–99)
GLUCOSE-CAPILLARY: 146 mg/dL — AB (ref 65–99)
Glucose-Capillary: 122 mg/dL — ABNORMAL HIGH (ref 65–99)
Glucose-Capillary: 130 mg/dL — ABNORMAL HIGH (ref 65–99)
Glucose-Capillary: 142 mg/dL — ABNORMAL HIGH (ref 65–99)
Glucose-Capillary: 98 mg/dL (ref 65–99)

## 2015-08-26 LAB — CBC
HCT: 29.7 % — ABNORMAL LOW (ref 36.0–46.0)
Hemoglobin: 9.8 g/dL — ABNORMAL LOW (ref 12.0–15.0)
MCH: 30.5 pg (ref 26.0–34.0)
MCHC: 33 g/dL (ref 30.0–36.0)
MCV: 92.5 fL (ref 78.0–100.0)
Platelets: 115 10*3/uL — ABNORMAL LOW (ref 150–400)
RBC: 3.21 MIL/uL — ABNORMAL LOW (ref 3.87–5.11)
RDW: 14.3 % (ref 11.5–15.5)
WBC: 14.6 10*3/uL — ABNORMAL HIGH (ref 4.0–10.5)

## 2015-08-26 LAB — BASIC METABOLIC PANEL
ANION GAP: 13 (ref 5–15)
BUN: 59 mg/dL — ABNORMAL HIGH (ref 6–20)
CHLORIDE: 96 mmol/L — AB (ref 101–111)
CO2: 27 mmol/L (ref 22–32)
Calcium: 7.9 mg/dL — ABNORMAL LOW (ref 8.9–10.3)
Creatinine, Ser: 2.33 mg/dL — ABNORMAL HIGH (ref 0.44–1.00)
GFR, EST AFRICAN AMERICAN: 23 mL/min — AB (ref >60)
GFR, EST NON AFRICAN AMERICAN: 20 mL/min — AB (ref >60)
Glucose, Bld: 129 mg/dL — ABNORMAL HIGH (ref 65–99)
POTASSIUM: 3.4 mmol/L — AB (ref 3.5–5.1)
SODIUM: 136 mmol/L (ref 135–145)

## 2015-08-26 LAB — CULTURE, RESPIRATORY

## 2015-08-26 LAB — CULTURE, RESPIRATORY W GRAM STAIN

## 2015-08-26 LAB — HEPARIN LEVEL (UNFRACTIONATED): Heparin Unfractionated: 0.45 IU/mL (ref 0.30–0.70)

## 2015-08-26 MED ORDER — DEXTROSE 5 % IV SOLN: 1.0000 g | INTRAVENOUS | Status: DC

## 2015-08-26 MED ORDER — ALBUTEROL SULFATE (2.5 MG/3ML) 0.083% IN NEBU
2.5000 mg | INHALATION_SOLUTION | Freq: Four times a day (QID) | RESPIRATORY_TRACT | Status: DC | PRN
Start: 2015-08-26 — End: 2015-08-27
  Administered 2015-08-26 – 2015-08-27 (×2): 2.5 mg via RESPIRATORY_TRACT
  Filled 2015-08-26 (×2): qty 3

## 2015-08-26 MED ORDER — FUROSEMIDE 10 MG/ML IJ SOLN: 20.0000 mg | Freq: Three times a day (TID) | INTRAMUSCULAR | Status: DC

## 2015-08-26 MED ORDER — DEXTROSE 5 % IV SOLN
1.0000 g | INTRAVENOUS | Status: AC
  Administered 2015-08-26 – 2015-08-27 (×2): 1 g via INTRAVENOUS
  Filled 2015-08-26 (×2): qty 10

## 2015-08-26 MED ORDER — FUROSEMIDE 10 MG/ML IJ SOLN
80.0000 mg | Freq: Once | INTRAMUSCULAR | Status: AC
  Administered 2015-08-26: 80 mg via INTRAVENOUS

## 2015-08-26 MED ORDER — FUROSEMIDE 10 MG/ML IJ SOLN
40.0000 mg | Freq: Three times a day (TID) | INTRAMUSCULAR | Status: DC
  Administered 2015-08-26 – 2015-08-27 (×3): 40 mg via INTRAVENOUS
  Filled 2015-08-26 (×5): qty 4

## 2015-08-26 MED ORDER — POTASSIUM CHLORIDE 10 MEQ/50ML IV SOLN
INTRAVENOUS | Status: AC
  Administered 2015-08-26: 10 meq
  Filled 2015-08-26: qty 150

## 2015-08-26 MED ORDER — FUROSEMIDE 10 MG/ML IJ SOLN
40.0000 mg | Freq: Once | INTRAMUSCULAR | Status: AC
  Administered 2015-08-26: 40 mg via INTRAVENOUS
  Filled 2015-08-26: qty 4

## 2015-08-26 MED ORDER — ANTISEPTIC ORAL RINSE SOLUTION (CORINZ)
7.0000 mL | OROMUCOSAL | Status: DC
  Administered 2015-08-26 – 2015-08-27 (×7): 7 mL via OROMUCOSAL

## 2015-08-26 MED ORDER — POTASSIUM CHLORIDE 10 MEQ/50ML IV SOLN
10.0000 meq | INTRAVENOUS | Status: AC
  Administered 2015-08-26 (×3): 10 meq via INTRAVENOUS

## 2015-08-26 MED ORDER — ZOLPIDEM TARTRATE 5 MG PO TABS
5.0000 mg | ORAL_TABLET | Freq: Every evening | ORAL | Status: DC | PRN
  Administered 2015-08-26: 5 mg via ORAL
  Filled 2015-08-26: qty 1
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
## 2015-08-26 NOTE — Progress Notes (Signed)
eLink Physician-Brief Progress Note Patient Name: Ann Carroll DOB: October 14, 1941 MRN: [id number]   Date of Service  08/26/2015  HPI/Events of Note  Some increase rr Still wheezing per RT, upper? Edema?  eICU Interventions  Get pcxr     Intervention Category Intermediate Interventions: Respiratory distress - evaluation and management  Takerra Lupinacci J. 08/26/2015, 10:24 PM

## 2015-08-26 NOTE — Progress Notes (Signed)
eLink Physician-Brief Progress Note Patient Name: Ann Carroll DOB: 1941-08-07 MRN: [id number]   Date of Service  08/26/2015  HPI/Events of Note  cxr shows progressive b/l infiltrates likely pulmonary edema  eICU Interventions  Lasix 80 mg Iv x 1 ordered based on creatinine        Erin Fulling 08/26/2015, 11:21 PM

## 2015-08-26 NOTE — Progress Notes (Signed)
eLink Physician-Brief Progress Note Patient Name: Ann Carroll DOB: Jan 19, 1941 MRN: [id number]   Date of Service  08/26/2015  HPI/Events of Note  Mild wheezing,. NO distress  eICU Interventions  albuteral neb     Intervention Category Intermediate Interventions: Bronchospasm - evaluation and treatment  FEINSTEIN,DANIEL J. 08/26/2015, 10:04 PM
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
## 2015-08-26 NOTE — Progress Notes (Signed)
eLink Physician-Brief Progress Note Patient Name: Ann Carroll DOB: 1941/11/17 MRN: [id number]   Date of Service  08/26/2015  HPI/Events of Note  insom  eICU Interventions  ambien     Intervention Category Minor Interventions: Routine modifications to care plan (e.g. PRN medications for pain, fever)  Nelda Bucks. 08/26/2015, 8:51 PM

## 2015-08-27 ENCOUNTER — Other Ambulatory Visit (HOSPITAL_COMMUNITY): Payer: Medicare Other

## 2015-08-27 ENCOUNTER — Other Ambulatory Visit: Payer: Self-pay | Admitting: *Deleted

## 2015-08-27 DIAGNOSIS — J811 Chronic pulmonary edema: Secondary | ICD-10-CM | POA: Insufficient documentation

## 2015-08-27 DIAGNOSIS — I2510 Atherosclerotic heart disease of native coronary artery without angina pectoris: Secondary | ICD-10-CM

## 2015-08-27 DIAGNOSIS — J810 Acute pulmonary edema: Secondary | ICD-10-CM

## 2015-08-27 DIAGNOSIS — J690 Pneumonitis due to inhalation of food and vomit: Secondary | ICD-10-CM

## 2015-08-27 LAB — BASIC METABOLIC PANEL
Anion gap: 12 (ref 5–15)
Anion gap: 14 (ref 5–15)
BUN: 57 mg/dL — ABNORMAL HIGH (ref 6–20)
BUN: 73 mg/dL — AB (ref 6–20)
CHLORIDE: 102 mmol/L (ref 101–111)
CO2: 24 mmol/L (ref 22–32)
CO2: 26 mmol/L (ref 22–32)
CREATININE: 1.83 mg/dL — AB (ref 0.44–1.00)
CREATININE: 2.11 mg/dL — AB (ref 0.44–1.00)
Calcium: 7.2 mg/dL — ABNORMAL LOW (ref 8.9–10.3)
Calcium: 8.9 mg/dL (ref 8.9–10.3)
Chloride: 94 mmol/L — ABNORMAL LOW (ref 101–111)
GFR calc non Af Amer: 26 mL/min — ABNORMAL LOW (ref >60)
GFR, EST AFRICAN AMERICAN: 30 mL/min — AB (ref >60)
GFR, EST AFRICAN AMERICAN: 26 mL/min — AB (ref >60)
GFR, EST NON AFRICAN AMERICAN: 22 mL/min — AB (ref >60)
GLUCOSE: 91 mg/dL (ref 65–99)
Glucose, Bld: 212 mg/dL — ABNORMAL HIGH (ref 65–99)
POTASSIUM: 4 mmol/L (ref 3.5–5.1)
Potassium: 2.8 mmol/L — ABNORMAL LOW (ref 3.5–5.1)
SODIUM: 134 mmol/L — AB (ref 135–145)
Sodium: 138 mmol/L (ref 135–145)

## 2015-08-27 LAB — CBC
HCT: 30.6 % — ABNORMAL LOW (ref 36.0–46.0)
Hemoglobin: 10.1 g/dL — ABNORMAL LOW (ref 12.0–15.0)
MCH: 30.4 pg (ref 26.0–34.0)
MCHC: 33 g/dL (ref 30.0–36.0)
MCV: 92.2 fL (ref 78.0–100.0)
Platelets: 124 10*3/uL — ABNORMAL LOW (ref 150–400)
RBC: 3.32 MIL/uL — ABNORMAL LOW (ref 3.87–5.11)
RDW: 14 % (ref 11.5–15.5)
WBC: 14.5 10*3/uL — ABNORMAL HIGH (ref 4.0–10.5)

## 2015-08-27 LAB — HEPARIN LEVEL (UNFRACTIONATED)
HEPARIN UNFRACTIONATED: 0.46 [IU]/mL (ref 0.30–0.70)
HEPARIN UNFRACTIONATED: 0.35 [IU]/mL (ref 0.30–0.70)
Heparin Unfractionated: 0.25 IU/mL — ABNORMAL LOW (ref 0.30–0.70)

## 2015-08-27 LAB — GLUCOSE, CAPILLARY
GLUCOSE-CAPILLARY: 92 mg/dL (ref 65–99)
Glucose-Capillary: 122 mg/dL — ABNORMAL HIGH (ref 65–99)
Glucose-Capillary: 139 mg/dL — ABNORMAL HIGH (ref 65–99)
Glucose-Capillary: 195 mg/dL — ABNORMAL HIGH (ref 65–99)
Glucose-Capillary: 193 mg/dL — ABNORMAL HIGH (ref 65–99)

## 2015-08-27 LAB — MAGNESIUM: Magnesium: 1.8 mg/dL (ref 1.7–2.4)

## 2015-08-27 MED ORDER — ENSURE ENLIVE PO LIQD
237.0000 mL | Freq: Two times a day (BID) | ORAL | Status: DC
  Administered 2015-08-27: 237 mL via ORAL

## 2015-08-27 MED ORDER — ASPIRIN 81 MG PO CHEW: 81.0000 mg | CHEWABLE_TABLET | Freq: Every day | ORAL | Status: DC

## 2015-08-27 MED ORDER — CETYLPYRIDINIUM CHLORIDE 0.05 % MT LIQD: 7.0000 mL | Freq: Two times a day (BID) | OROMUCOSAL | Status: DC

## 2015-08-27 MED ORDER — INSULIN ASPART 100 UNIT/ML ~~LOC~~ SOLN: 4.0000 [IU] | Freq: Three times a day (TID) | SUBCUTANEOUS | Status: DC

## 2015-08-27 MED ORDER — INSULIN ASPART 100 UNIT/ML ~~LOC~~ SOLN
0.0000 [IU] | Freq: Every day | SUBCUTANEOUS | Status: DC
  Administered 2015-08-28: 2 [IU] via SUBCUTANEOUS

## 2015-08-27 MED ORDER — TRAZODONE HCL 50 MG PO TABS
50.0000 mg | ORAL_TABLET | Freq: Every evening | ORAL | Status: DC | PRN
  Administered 2015-08-27: 50 mg via ORAL
  Filled 2015-08-27: qty 1

## 2015-08-27 MED ORDER — MAGNESIUM SULFATE 2 GM/50ML IV SOLN
2.0000 g | Freq: Once | INTRAVENOUS | Status: AC
  Administered 2015-08-27: 2 g via INTRAVENOUS
  Filled 2015-08-27: qty 50

## 2015-08-27 MED ORDER — ATORVASTATIN CALCIUM 80 MG PO TABS
80.0000 mg | ORAL_TABLET | Freq: Every day | ORAL | Status: DC
  Administered 2015-08-27: 80 mg via ORAL
  Filled 2015-08-27: qty 1

## 2015-08-27 MED ORDER — IPRATROPIUM BROMIDE 0.02 % IN SOLN: 0.5000 mg | RESPIRATORY_TRACT | Status: DC | PRN

## 2015-08-27 MED ORDER — INSULIN ASPART 100 UNIT/ML ~~LOC~~ SOLN
0.0000 [IU] | Freq: Three times a day (TID) | SUBCUTANEOUS | Status: DC
  Administered 2015-08-27: 2 [IU] via SUBCUTANEOUS
  Administered 2015-08-27: 3 [IU] via SUBCUTANEOUS

## 2015-08-27 MED ORDER — FUROSEMIDE 10 MG/ML IJ SOLN
40.0000 mg | Freq: Four times a day (QID) | INTRAMUSCULAR | Status: DC
  Administered 2015-08-27 – 2015-08-28 (×3): 40 mg via INTRAVENOUS
  Filled 2015-08-27 (×3): qty 4

## 2015-08-27 MED ORDER — ALBUTEROL SULFATE (2.5 MG/3ML) 0.083% IN NEBU: 2.5000 mg | INHALATION_SOLUTION | RESPIRATORY_TRACT | Status: DC | PRN

## 2015-08-27 MED ORDER — LEVOTHYROXINE SODIUM 150 MCG PO TABS: 150.0000 ug | ORAL_TABLET | Freq: Every day | ORAL | Status: DC

## 2015-08-27 MED ORDER — POTASSIUM CHLORIDE 10 MEQ/50ML IV SOLN
10.0000 meq | INTRAVENOUS | Status: AC
  Administered 2015-08-27 (×5): 10 meq via INTRAVENOUS
  Filled 2015-08-27 (×5): qty 50

## 2015-08-27 MED ORDER — PREDNISONE 20 MG PO TABS
40.0000 mg | ORAL_TABLET | Freq: Every day | ORAL | Status: DC
  Administered 2015-08-27: 40 mg via ORAL
  Filled 2015-08-27: qty 2

## 2015-08-27 MED ORDER — ATORVASTATIN CALCIUM 20 MG PO TABS
20.0000 mg | ORAL_TABLET | Freq: Every day | ORAL | Status: DC
  Filled 2015-08-27: qty 1
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
## 2015-08-28 ENCOUNTER — Inpatient Hospital Stay (HOSPITAL_COMMUNITY): Payer: Medicare Other

## 2015-08-28 ENCOUNTER — Inpatient Hospital Stay (HOSPITAL_COMMUNITY): Payer: Medicare Other | Admitting: Anesthesiology

## 2015-08-28 LAB — BASIC METABOLIC PANEL
Anion gap: 13 (ref 5–15)
BUN: 78 mg/dL — ABNORMAL HIGH (ref 6–20)
CO2: 27 mmol/L (ref 22–32)
Calcium: 9.1 mg/dL (ref 8.9–10.3)
Chloride: 96 mmol/L — ABNORMAL LOW (ref 101–111)
Creatinine, Ser: 2.08 mg/dL — ABNORMAL HIGH (ref 0.44–1.00)
GFR calc Af Amer: 26 mL/min — ABNORMAL LOW (ref >60)
GFR calc non Af Amer: 22 mL/min — ABNORMAL LOW (ref >60)
Glucose, Bld: 196 mg/dL — ABNORMAL HIGH (ref 65–99)
Potassium: 3.7 mmol/L (ref 3.5–5.1)
Sodium: 136 mmol/L (ref 135–145)

## 2015-08-28 LAB — HEPARIN LEVEL (UNFRACTIONATED): Heparin Unfractionated: 0.27 IU/mL — ABNORMAL LOW (ref 0.30–0.70)

## 2015-08-28 LAB — POCT I-STAT 3, ART BLOOD GAS (G3+)
ACID-BASE EXCESS: 11 mmol/L — AB (ref 0.0–2.0)
Bicarbonate: 35.8 mEq/L — ABNORMAL HIGH (ref 20.0–24.0)
O2 Saturation: 97 %
PH ART: 7.458 — AB (ref 7.350–7.450)
PO2 ART: 91 mmHg (ref 80.0–100.0)
TCO2: 37 mmol/L (ref 0–100)
pCO2 arterial: 50.4 mmHg — ABNORMAL HIGH (ref 35.0–45.0)

## 2015-08-28 LAB — CBC
HCT: 30.4 % — ABNORMAL LOW (ref 36.0–46.0)
Hemoglobin: 10.3 g/dL — ABNORMAL LOW (ref 12.0–15.0)
MCH: 31.3 pg (ref 26.0–34.0)
MCHC: 33.9 g/dL (ref 30.0–36.0)
MCV: 92.4 fL (ref 78.0–100.0)
Platelets: 140 10*3/uL — ABNORMAL LOW (ref 150–400)
RBC: 3.29 MIL/uL — ABNORMAL LOW (ref 3.87–5.11)
RDW: 14.1 % (ref 11.5–15.5)
WBC: 18.2 10*3/uL — ABNORMAL HIGH (ref 4.0–10.5)

## 2015-08-28 LAB — MAGNESIUM: Magnesium: 2.5 mg/dL — ABNORMAL HIGH (ref 1.7–2.4)

## 2015-08-28 LAB — GLUCOSE, CAPILLARY: GLUCOSE-CAPILLARY: 222 mg/dL — AB (ref 65–99)

## 2015-08-28 MED ORDER — EPINEPHRINE HCL 0.1 MG/ML IJ SOSY
PREFILLED_SYRINGE | INTRAMUSCULAR | Status: AC
  Filled 2015-08-28: qty 30

## 2015-08-28 MED ORDER — ATROPINE SULFATE 0.1 MG/ML IJ SOLN
INTRAMUSCULAR | Status: AC
  Filled 2015-08-28: qty 10

## 2015-08-28 MED ORDER — EPINEPHRINE HCL 0.1 MG/ML IJ SOSY
PREFILLED_SYRINGE | INTRAMUSCULAR | Status: AC
  Filled 2015-08-28: qty 10

## 2015-08-28 MED ORDER — EPINEPHRINE HCL 0.1 MG/ML IJ SOSY
PREFILLED_SYRINGE | INTRAMUSCULAR | Status: AC
  Filled 2015-08-28: qty 50
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
## 2015-08-28 NOTE — Progress Notes (Signed)
Patient free of chest pain. Pain relief spontaneous. No medication given to cause relief of pain. Will continue to monitor

## 2015-08-28 NOTE — Progress Notes (Signed)
eLink Physician-Brief Progress Note Patient Name: Ann Carroll DOB: 12-06-1941 MRN: [id number]   Date of Service  09-22-2015  HPI/Events of Note  ROSC severe ectopy.  eICU Interventions  Dr. Delton Coombes bedside.  Elink will sign off.        [name] 2015-09-22, 7:16 AM

## 2015-08-28 NOTE — Progress Notes (Signed)
The patient expired this am  Ann Carroll \\9 /16/2016

## 2015-08-28 NOTE — Anesthesia Procedure Notes (Signed)
Procedure Name: Intubation Date/Time: 09/07/2015 7:03 AM Performed by: Edmonia Caprio Pre-anesthesia Checklist: Patient identified, Emergency Drugs available, Suction available, Patient being monitored and Timeout performed Patient Re-evaluated:Patient Re-evaluated prior to inductionOxygen Delivery Method: Circle system utilized Laryngoscope Size: Hyacinth Meeker and 3 Grade View: Grade I Tube type: Oral Tube size: 7.5 mm Number of attempts: 1 Airway Equipment and Method: Stylet Placement Confirmation: ETT inserted through vocal cords under direct vision,  positive ETCO2,  CO2 detector and breath sounds checked- equal and bilateral Secured at: 23 (22) cm Tube secured with: Tape Dental Injury: Teeth and Oropharynx as per pre-operative assessment  Comments: Responded to Code Blue, patient at first responsive and talking, then agonally breathing.  Removed patient's upper dentures (gave to nurse for safekeeping).  DL x1 with miller 3, Gr 1 view.  Patient coughing with intubation.  +BBS, +ETC02.  BBS confirmed by myself and RT.  +ETCO2 per zoll monitoring.  CPR in progress.  See Code blue record for all other events.

## 2015-08-28 NOTE — Progress Notes (Signed)
   08/22/2015 0815  Clinical Encounter Type  Visited With Patient and family together;Health care provider  Visit Type Initial;Code;Death  Referral From Nurse   Chaplain responded to a code Blue, and met with the family, after patient deceased. Chaplain offered support and gave information about Patient Placement. Chaplain support available as needed.   Jeri Lager, Chaplain 08/22/2015

## 2015-08-28 NOTE — Procedures (Signed)
PCCM Interval Note / Code Blue Note  Called to patient's bedside urgently this am after acute decompensation. She is 74 status post admission for VT/VF arrest and therapeutic hypothermia. Her evaluation has revealed severe coronary disease including a tight left main and RCA occlusions. She was reported to have experienced chest pain intermittently through the night and into this morning. She then experienced a bradycardic arrest resulting in PEA around 6:53. She received immediate CPR and briefly regained consciousness without intubation. Unfortunately she devolved back into a non-perfusing rhythm that appeared to be a junctional rhythm with frequent PVCs. CPR was initiated, she was urgently intubated. On my arrival at 7:15 patient was undergoing ACLS under the management of Dr. Senaida Ores with IMTS. Pulses had only been reobtained transiently in the interim. Her rhythm strip remained as described above. An ABG did not reveal significant acidosis. No metabolic disarray was revealed.  I gave her a loading dose of amiodarone given her ventricular ectopy, attempted transcutaneous pacing but was unable to achieve capture. Unfortunately pulses were never reobtained. Quick bedside echocardiogram revealed agonal myocardial contraction that did not produce a pulse on multiple occasions through the resuscitation effort. CPR was continued for approximately 35 minutes. Without any clear reversible causes all present agreed that the patient would not survive this event. I suspect that she continued to have intermittent cardiac ischemia that worsened pulm edema, compounding her myocardial stress and leading to overall decompensation. The code was stopped in death declared at 7:38. Please refer also to Dr Berenda Morale Note.   Levy Pupa, MD, PhD 08/27/2015, 8:58 AM [facility] [phone] or if no answer [phone]

## 2015-08-28 NOTE — Progress Notes (Addendum)
Patient having 9/10 mid chest pain. Patient had been having intermittent rib cage pain when coughing during the day that resolved with Fentanyl prn. This time Fentanyl did not relieve the pain. EKG done. MD paged. No new orders given. Will continue to monitor.

## 2015-08-28 NOTE — Progress Notes (Signed)
RT responded to Code Blue. Pt manually ventilated with Ambu bag and mask until Intubated. Upon intubation using Miller #3, on 1st attempt, pt continued to be manually ventilated throughout length of code. Pt suctioned using 28F catheter down ett on 3 separate occasions as there was moderate amount of hemoptysis. ABG drawn upon ROSC and within normal limits. CCM MD notified at bedside of results. Pt expired, ett remains in place pending possible ME case. RT will continue to monitor
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
## 2015-08-28 NOTE — Progress Notes (Signed)
eLink Physician-Brief Progress Note Patient Name: Ann Carroll DOB: 1941-02-03 MRN: [id number]   Date of Service  08/20/2015  HPI/Events of Note  Patient intubated by anesthesia.  Another PEA bradycardic arrest.  Currently being coded.  eICU Interventions  PCCM-NP bedside. Dr. Jasmine Awe and Dr. Cyril Mourning notified and will go there ASAP. Cardiology notified. Code team bedside. CXR ordered to verify ETT if able to obtain ROSC. Anesthesia remains bedside and verified ETT by auscultation.        YACOUB,WESAM 08/14/2015, 7:07 AM

## 2015-08-28 NOTE — Progress Notes (Signed)
eLink Physician-Brief Progress Note Patient Name: Aleigha Gilani DOB: 25-Oct-1941 MRN: [id number]   Date of Service  2015-09-15  HPI/Events of Note  Cardiac arrest, appears to be bradycardic arrest in nature.  Code team responded.  Patient had ROSC quickly and was not intubated.   Anesthesia and code team bedside.  eICU Interventions  PCCM-NP and MD notified.  Transfer to the ICU. Atropine  To bedside. Anesthesia to intubate.        YACOUB,WESAM 09-15-15, 6:58 AM

## 2015-08-28 NOTE — Progress Notes (Signed)
Patient nauseous and diaphoretic. Patients states she feels this way when her blood sugar is elevated. CBG was 222. CCM paged. Ordered to give sliding scale coverage at this time. Will continue to monitor.

## 2015-08-31 ENCOUNTER — Telehealth: Payer: Self-pay

## 2015-08-31 ENCOUNTER — Encounter (HOSPITAL_COMMUNITY): Payer: Medicare Other

## 2015-08-31 MED FILL — Medication: Qty: 1 | Status: AC

## 2015-08-31 NOTE — Telephone Encounter (Signed)
On 2015/09/27 I received a death certificate from [facility]. The death certificate is for cremation. The patient is a patient of Doctor Sood. The death certificate will be taken to the pulmonary unit for signature this pm. On 09/27/2015 I received the death certificate back from Doctor Wiseman. I got the death certificate ready for pickup and called the funeral home to let them know the death certificate is ready for pickup.
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
## 2015-09-12 DEATH — deceased

## 2015-10-09 IMAGING — CR DG CHEST 1V PORT
1 series · 1 of 1 positions shown · non-contrast
Comparison: Radiograph dated 08/25/2015

CLINICAL DATA: 74-year-old female with pulmonary edema

EXAM:
PORTABLE CHEST - 1 VIEW

[AP]
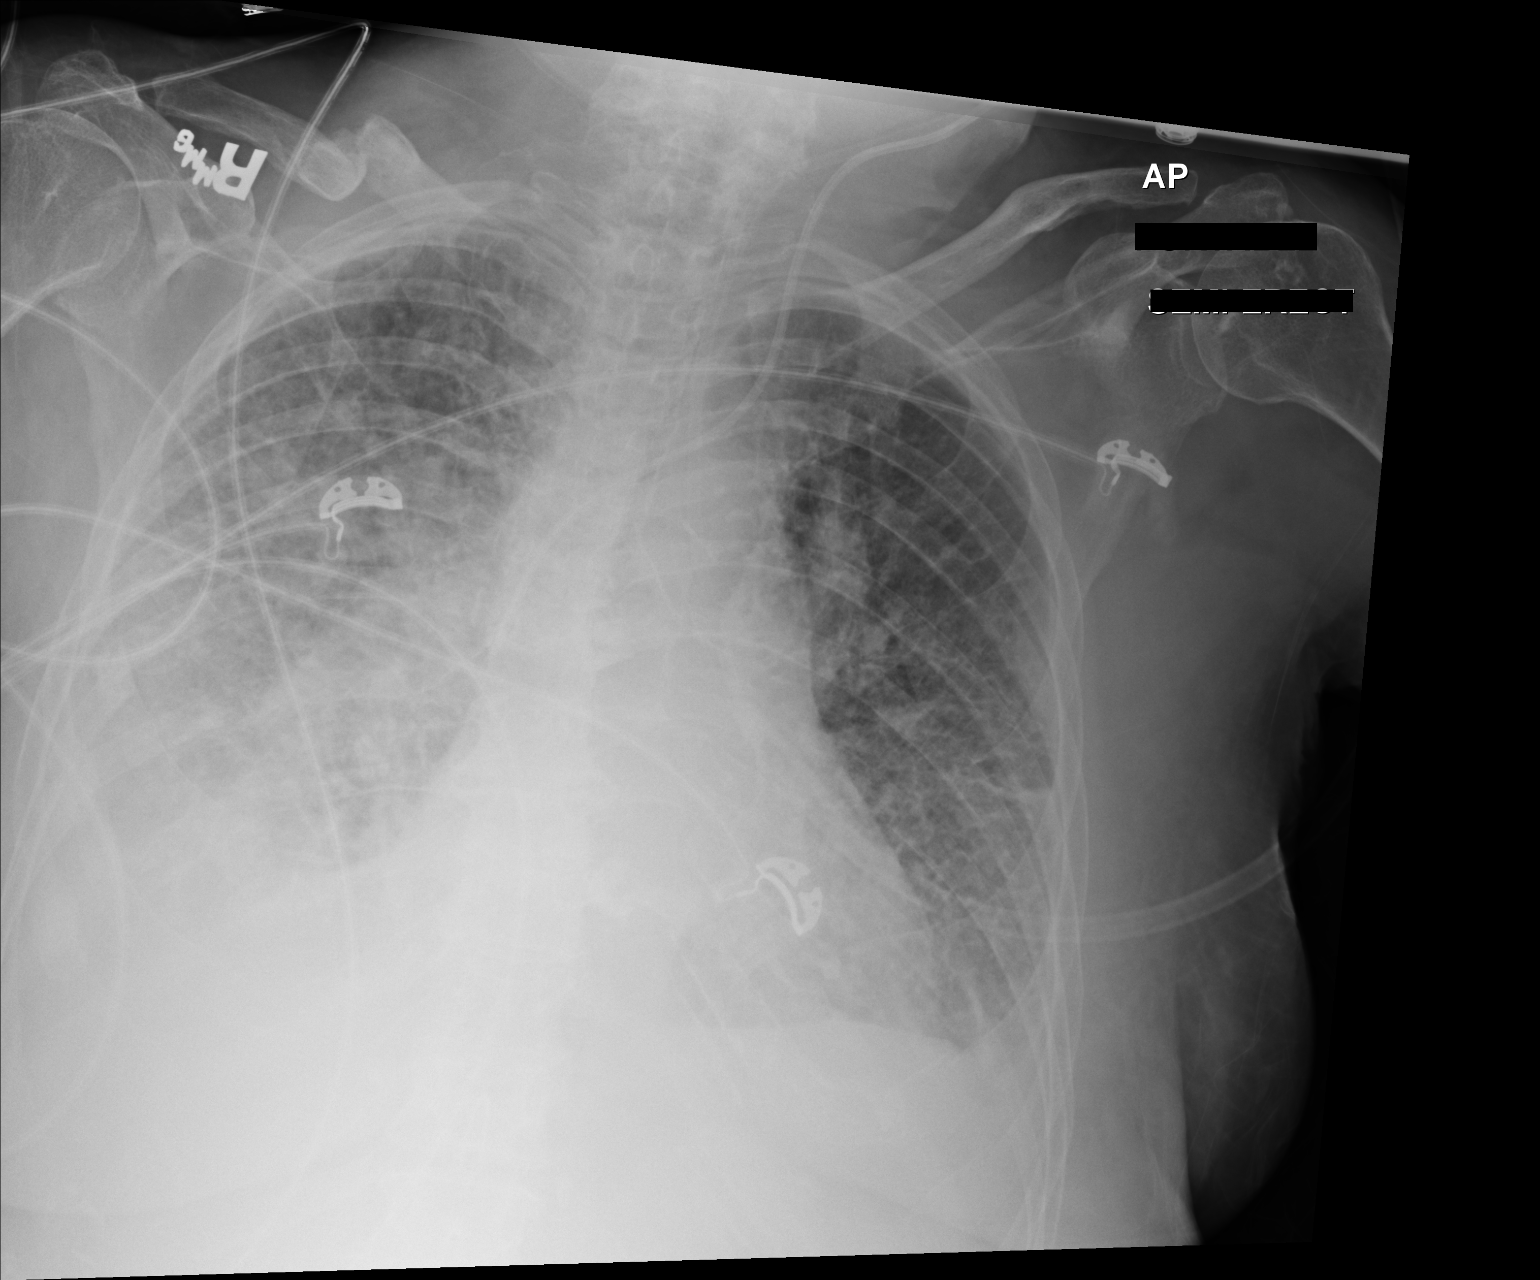

[1 of 1 positions shown; findings below may reference images not displayed]

FINDINGS: Left IJ central line with tip at cavoatrial junction in stable
positioning. There has been interval increase in the bilateral
airspace and interstitial densities predominantly involving the mid
to lower lung field compatible with congestive changes. Superimposed
pneumonia is not excluded. Small bilateral pleural effusions, right
greater left. No pneumothorax. There is an old right clavicular
fracture with nonunion.
IMPRESSION: Interval progression of congestive heart failure. Superimposed
pneumonia is not excluded. Clinical correlation and follow-up
recommended.
